# Patient Record
Sex: Female | Born: 1990 | Race: Black or African American | Hispanic: No | Marital: Single | Smoking: Never smoker
Health system: Southern US, Community
[De-identification: ages and names within clinical notes are randomized; demographics above are authoritative.]

## PROBLEM LIST (undated history)

## (undated) ENCOUNTER — Inpatient Hospital Stay: Payer: Self-pay

## (undated) DIAGNOSIS — B9689 Other specified bacterial agents as the cause of diseases classified elsewhere: Secondary | ICD-10-CM

## (undated) DIAGNOSIS — N760 Acute vaginitis: Secondary | ICD-10-CM

## (undated) DIAGNOSIS — A599 Trichomoniasis, unspecified: Secondary | ICD-10-CM

## (undated) HISTORY — DX: Other specified bacterial agents as the cause of diseases classified elsewhere: B96.89

## (undated) HISTORY — DX: Trichomoniasis, unspecified: A59.9

## (undated) HISTORY — DX: Other specified bacterial agents as the cause of diseases classified elsewhere: N76.0

## (undated) HISTORY — PX: NO PAST SURGERIES: SHX2092

---
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
## 2010-01-07 ENCOUNTER — Emergency Department: Payer: Self-pay | Admitting: Emergency Medicine

## 2010-01-07 ENCOUNTER — Emergency Department: Payer: Self-pay | Admitting: Internal Medicine

## 2010-06-27 ENCOUNTER — Emergency Department: Payer: Self-pay | Admitting: Emergency Medicine

## 2011-08-29 ENCOUNTER — Emergency Department: Payer: Self-pay | Admitting: Emergency Medicine

## 2012-01-06 ENCOUNTER — Emergency Department: Payer: Self-pay | Admitting: Internal Medicine

## 2012-01-06 LAB — PREGNANCY, URINE: Pregnancy Test, Urine: NEGATIVE m[IU]/mL

## 2012-01-06 LAB — URINALYSIS, COMPLETE
Bilirubin,UR: NEGATIVE
Blood: NEGATIVE
Glucose,UR: NEGATIVE mg/dL (ref 0–75)
Ketone: NEGATIVE
Protein: NEGATIVE
RBC,UR: NONE SEEN /HPF (ref 0–5)
Specific Gravity: 1.025 (ref 1.003–1.030)

## 2012-07-28 ENCOUNTER — Emergency Department: Payer: Self-pay | Admitting: Emergency Medicine

## 2012-07-28 LAB — URINALYSIS, COMPLETE
Bilirubin,UR: NEGATIVE
Glucose,UR: NEGATIVE mg/dL (ref 0–75)
Ph: 5 (ref 4.5–8.0)
Protein: NEGATIVE
RBC,UR: 3 /HPF (ref 0–5)
Specific Gravity: 1.026 (ref 1.003–1.030)
WBC UR: 589 /HPF (ref 0–5)

## 2012-07-30 LAB — URINE CULTURE

## 2012-10-27 ENCOUNTER — Emergency Department: Payer: Self-pay | Admitting: Emergency Medicine

## 2012-10-27 LAB — COMPREHENSIVE METABOLIC PANEL
Albumin: 3.8 g/dL (ref 3.4–5.0)
Alkaline Phosphatase: 53 U/L (ref 50–136)
Anion Gap: 8 (ref 7–16)
BUN: 12 mg/dL (ref 7–18)
Bilirubin,Total: 0.7 mg/dL (ref 0.2–1.0)
Calcium, Total: 8.3 mg/dL — ABNORMAL LOW (ref 8.5–10.1)
Chloride: 106 mmol/L (ref 98–107)
Co2: 22 mmol/L (ref 21–32)
Creatinine: 0.77 mg/dL (ref 0.60–1.30)
EGFR (African American): >60
Glucose: 100 mg/dL — ABNORMAL HIGH (ref 65–99)
Potassium: 3.4 mmol/L — ABNORMAL LOW (ref 3.5–5.1)
SGOT(AST): 18 U/L (ref 15–37)
SGPT (ALT): 17 U/L (ref 12–78)
Sodium: 136 mmol/L (ref 136–145)
Total Protein: 7.8 g/dL (ref 6.4–8.2)

## 2012-10-27 LAB — URINALYSIS, COMPLETE
Bacteria: NONE SEEN
Glucose,UR: NEGATIVE mg/dL (ref 0–75)
Ketone: NEGATIVE
Leukocyte Esterase: NEGATIVE
Ph: 5 (ref 4.5–8.0)
RBC,UR: 5 /HPF (ref 0–5)
Specific Gravity: 1.035 (ref 1.003–1.030)
WBC UR: 4 /HPF (ref 0–5)

## 2012-10-27 LAB — CBC
HCT: 39.9 % (ref 35.0–47.0)
MCHC: 34.7 g/dL (ref 32.0–36.0)
RDW: 12.2 % (ref 11.5–14.5)
WBC: 7.6 10*3/uL (ref 3.6–11.0)

## 2012-10-27 LAB — RAPID INFLUENZA A&B ANTIGENS

## 2014-01-20 ENCOUNTER — Emergency Department: Payer: Self-pay | Admitting: Internal Medicine

## 2014-12-20 ENCOUNTER — Emergency Department: Admit: 2014-12-20 | Disposition: A | Discharge status: Home / Self Care | Payer: Self-pay | Admitting: Student

## 2015-01-04 DIAGNOSIS — L700 Acne vulgaris: Secondary | ICD-10-CM | POA: Insufficient documentation

## 2015-03-29 ENCOUNTER — Encounter: Payer: Self-pay | Admitting: *Deleted

## 2015-03-29 ENCOUNTER — Emergency Department
Admission: EM | Admit: 2015-03-29 | Discharge: 2015-03-29 | Disposition: A | Discharge status: Home / Self Care | Payer: BLUE CROSS/BLUE SHIELD | Attending: Emergency Medicine | Admitting: Emergency Medicine

## 2015-03-29 ENCOUNTER — Emergency Department: Payer: BLUE CROSS/BLUE SHIELD

## 2015-03-29 DIAGNOSIS — Y9389 Activity, other specified: Secondary | ICD-10-CM | POA: Diagnosis not present

## 2015-03-29 DIAGNOSIS — Y998 Other external cause status: Secondary | ICD-10-CM | POA: Insufficient documentation

## 2015-03-29 DIAGNOSIS — S39012A Strain of muscle, fascia and tendon of lower back, initial encounter: Secondary | ICD-10-CM | POA: Insufficient documentation

## 2015-03-29 DIAGNOSIS — S3992XA Unspecified injury of lower back, initial encounter: Secondary | ICD-10-CM | POA: Diagnosis present

## 2015-03-29 DIAGNOSIS — Y92410 Unspecified street and highway as the place of occurrence of the external cause: Secondary | ICD-10-CM | POA: Insufficient documentation

## 2015-03-29 MED ORDER — KETOROLAC TROMETHAMINE 60 MG/2ML IM SOLN
60.0000 mg | Freq: Once | INTRAMUSCULAR | Status: AC
  Administered 2015-03-29: 60 mg via INTRAMUSCULAR
  Filled 2015-03-29: qty 2

## 2015-03-29 MED ORDER — ORPHENADRINE CITRATE 30 MG/ML IJ SOLN
60.0000 mg | INTRAMUSCULAR | Status: AC
  Administered 2015-03-29: 60 mg via INTRAMUSCULAR
  Filled 2015-03-29: qty 2

## 2015-03-29 MED ORDER — KETOROLAC TROMETHAMINE 10 MG PO TABS: 10.0000 mg | ORAL_TABLET | Freq: Three times a day (TID) | ORAL | Status: DC

## 2015-03-29 MED ORDER — CYCLOBENZAPRINE HCL 5 MG PO TABS: 5.0000 mg | ORAL_TABLET | Freq: Three times a day (TID) | ORAL | Status: DC | PRN
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
## 2015-03-29 NOTE — ED Notes (Signed)
Pt on back board with ccollar on

## 2015-03-29 NOTE — ED Notes (Signed)
Assessment per PA 
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
## 2015-12-20 ENCOUNTER — Encounter: Payer: Self-pay | Admitting: Emergency Medicine

## 2015-12-20 ENCOUNTER — Emergency Department
Admission: EM | Admit: 2015-12-20 | Discharge: 2015-12-20 | Disposition: A | Discharge status: Home / Self Care | Payer: BLUE CROSS/BLUE SHIELD | Attending: Emergency Medicine | Admitting: Emergency Medicine

## 2015-12-20 DIAGNOSIS — K029 Dental caries, unspecified: Secondary | ICD-10-CM | POA: Insufficient documentation

## 2015-12-20 DIAGNOSIS — K0889 Other specified disorders of teeth and supporting structures: Secondary | ICD-10-CM | POA: Diagnosis present

## 2015-12-20 MED ORDER — LIDOCAINE VISCOUS 2 % MT SOLN: 20.0000 mL | OROMUCOSAL | Status: DC | PRN

## 2015-12-20 MED ORDER — LIDOCAINE VISCOUS 2 % MT SOLN
OROMUCOSAL | Status: AC
  Administered 2015-12-20: 15 mL via OROMUCOSAL
  Filled 2015-12-20: qty 15

## 2015-12-20 MED ORDER — LIDOCAINE VISCOUS 2 % MT SOLN
15.0000 mL | Freq: Once | OROMUCOSAL | Status: AC
  Administered 2015-12-20: 15 mL via OROMUCOSAL

## 2015-12-20 MED ORDER — AMOXICILLIN 500 MG PO TABS: 500.0000 mg | ORAL_TABLET | Freq: Two times a day (BID) | ORAL | Status: DC

## 2015-12-20 MED ORDER — HYDROCODONE-ACETAMINOPHEN 5-325 MG PO TABS: 1.0000 | ORAL_TABLET | ORAL | Status: DC | PRN

## 2015-12-20 NOTE — ED Provider Notes (Signed)
[facility] Emergency Department Provider Note  ____________________________________________  Time seen: Approximately 1:46 PM  I have reviewed the triage vital signs and the nursing notes.   HISTORY  Chief Complaint Dental Pain    HPI Colleen Peterson is a 25 y.o. female who presents for evaluation of a toothache. Patient reports she has a Education officer, communitydentist appointment tomorrow. She needs something to help get her through today and tonight.. Patient denies any other trauma or complaints at this time.   History reviewed. No pertinent past medical history.  There are no active problems to display for this patient.   History reviewed. No pertinent past surgical history.  Current Outpatient Rx  Name  Route  Sig  Dispense  Refill  . amoxicillin (AMOXIL) 500 MG tablet   Oral   Take 1 tablet (500 mg total) by mouth 2 (two) times daily.   20 tablet   0   . HYDROcodone-acetaminophen (NORCO) 5-325 MG tablet   Oral   Take 1-2 tablets by mouth every 4 (four) hours as needed for moderate pain.   15 tablet   0   . ISOtretinoin (ACCUTANE) 40 MG capsule   Oral   Take 40 mg by mouth 2 (two) times daily.         Marland Kitchen. lidocaine (XYLOCAINE) 2 % solution   Mouth/Throat   Use as directed 20 mLs in the mouth or throat as needed for mouth pain.   100 mL   0     Allergies Review of patient's allergies indicates no known allergies.  No family history on file.  Social History Social History  Substance Use Topics  . Smoking status: Never Smoker   . Smokeless tobacco: None  . Alcohol Use: Yes    Review of Systems Constitutional: No fever/chills Eyes: No visual changes. ENT: Positive for dental caries and dental pain left upper molars. Positive erythema around the gums. Genitourinary: Negative for dysuria. Musculoskeletal: Negative for back pain. Skin: Negative for rash. Neurological: Negative for headaches, focal weakness or numbness.  10-point ROS  otherwise negative.  ____________________________________________   PHYSICAL EXAM:  VITAL SIGNS: ED Triage Vitals  Enc Vitals Group     BP 12/20/15 1314 135/82 mmHg     Pulse Rate 12/20/15 1314 78     Resp 12/20/15 1314 16     Temp 12/20/15 1314 98.4 F (36.9 C)     Temp Source 12/20/15 1314 Oral     SpO2 12/20/15 1314 98 %     Weight 12/20/15 1314 155 lb (70.308 kg)     Height 12/20/15 1314 5\' 4"  (1.626 m)     Head Cir --      Peak Flow --      Pain Score 12/20/15 1309 10     Pain Loc --      Pain Edu? --      Excl. in GC? --     Constitutional: Alert and oriented. Well appearing and in no acute distress.n/rhinnorhea. Mouth/Throat: Mucous membranes are moist.  Oropharynx non-erythematous.Positive for dental caries. Neck: No stridor.   Neurologic:  Normal speech and language. No gross focal neurologic deficits are appreciated. No gait instability. Skin:  Skin is warm, dry and intact. No rash noted. Psychiatric: Mood and affect are normal. Speech and behavior are normal.  ____________________________________________   LABS (all labs ordered are listed, but only abnormal results are displayed)  Labs Reviewed - No data to display ____________________________________________    PROCEDURES  Procedure(s) performed: None  Critical Care performed: No  ____________________________________________   INITIAL IMPRESSION / ASSESSMENT AND PLAN / ED COURSE  Pertinent labs & imaging results that were available during my care of the patient were reviewed by me and considered in my medical decision making (see chart for details).  Positive dental pain with dental caries. Rx given for Amoxil 500 mg 3 times a day hydrocodone 5/325 viscous lidocaine. Patient to follow up PCP or return to the ER as needed. She denies any other emergency medical complaints at this time. ____________________________________________   FINAL CLINICAL IMPRESSION(S) / ED DIAGNOSES  Final  diagnoses:  Pain, dental     This chart was dictated using voice recognition software/Dragon. Despite best efforts to proofread, errors can occur which can change the meaning. Any change was purely unintentional.   Evangeline Dakin, PA-C 12/20/15 1541  Myrna Blazer, MD 12/20/15 [phone]

## 2015-12-20 NOTE — Discharge Instructions (Signed)
OPTIONS FOR DENTAL FOLLOW UP CARE ° °[place] Department of Health and Human Services - Local Safety Net Dental Clinics °http://www.ncdhhs.gov/dph/oralhealth/services/safetynetclinics.htm °  °[facility] (336-562-3123) ° °[facility] (919-933-9087) ° °[facility] ([phone] ext [phone]) ° °[facility] ([phone]) ° °[facility] ([phone]) °This clinic caters to the indigent population and is on a lottery system. °Location: °[facility], [address], [place] °Clinic Hours: °Wednesdays from 6pm - 9pm, patients seen by a lottery system. °For dates, call or go to [email or web address].[facility].edu/[facility]/patients/Dental-[facility] °Services: °Cleanings, fillings and simple extractions. °Payment Options: °DENTAL WORK IS FREE OF CHARGE. Bring proof of income or support. °Best way to get seen: °Arrive at 5:15 pm - this is a lottery, NOT first come/first serve, so arriving earlier will not increase your chances of being seen. °  °  °[facility] °[phone] °Select option 1 for emergencies °  °Location: °[facility], [address], [place] °Clinic Hours: °No walk-ins accepted - call the day before to schedule an appointment. °Check in times are 9:30 am and 1:30 pm. °Services: °Simple extractions, temporary fillings, pulpectomy/pulp debridement, uncomplicated abscess drainage. °Payment Options: °PAYMENT IS DUE AT THE TIME OF SERVICE.  Fee is usually $100-200, additional surgical procedures (e.g. abscess drainage) may be extra. °Cash, checks, Visa/MasterCard accepted.  Can file Medicaid if patient is covered for dental - patient should call case worker to check. °No discount for [facility] patients. °Best way to get seen: °MUST call the day before and get onto the schedule. Can usually be seen the next 1-2 days. No walk-ins accepted. °  °  °[facility] °[phone] °   °Location: °[facility], [address], [place] °Clinic Hours: °M, W, Th, F 8am or 1:30pm, Tues 9a or 1:30 - first come/first served. °Services: °Simple extractions, temporary fillings, uncomplicated abscess drainage.  You do not need to be an [place] resident. °Payment Options: °PAYMENT IS DUE AT THE TIME OF SERVICE. °Dental insurance, otherwise sliding scale - bring proof of income or support. °Depending on income and treatment needed, cost is usually $50-200. °Best way to get seen: °Arrive early as it is first come/first served. °  °  °[facility] °[phone] °  °Location: °[address] °Clinic Hours: °Mon-Thu 8a-5p °Services: °Most basic dental services including extractions and fillings. °Payment Options: °PAYMENT IS DUE AT THE TIME OF SERVICE. °Sliding scale, up to 50% off - bring proof if income or support. °Medicaid with dental option accepted. °Best way to get seen: °Call to schedule an appointment, can usually be seen within 2 weeks OR they will try to see walk-ins - show up at 8a or 2p (you may have to wait). °  °  °[facility] °919-245-2435 °[place] RESIDENTS ONLY °  °Location: °[facility], [address], [place], [place] [address] °Clinic Hours: By appointment only. °Monday - Thursday 8am-5pm, Friday 8am-12pm °Services: Cleanings, fillings, extractions. °Payment Options: °PAYMENT IS DUE AT THE TIME OF SERVICE. °Cash, Visa or MasterCard. Sliding scale - $30 minimum per service. °Best way to get seen: °Come in to office, complete packet and make an appointment - need proof of income °or support monies for each household member and proof of [place] residence. °Usually takes about a month to get in. °  °  °[facility] °[phone] °  °Location: °[address]   [place] °Clinic Hours: Walk-in Urgent Care Dental Services are offered Monday-Friday  mornings only. °The numbers of emergencies accepted daily is limited to the number of °providers available. °Maximum 15 - Mondays, Wednesdays & Thursdays °Maximum 10 - Tuesdays & Fridays °Services: °You do not need to be a [place] resident to be seen for a dental emergency. °Emergencies are defined as pain, swelling, abnormal bleeding, or dental trauma. Walkins will receive x-rays if needed. °NOTE: Dental cleaning is not an emergency. °Payment Options: °PAYMENT IS DUE AT THE TIME OF SERVICE. °Minimum co-pay is $40.00 for uninsured patients. °Minimum co-pay is $3.00 for Medicaid with dental coverage. °Dental Insurance is accepted and must be presented at time of visit. °Medicare does not cover dental. °Forms of payment: Cash, credit card, checks. °Best way to get seen: °If not previously registered with the clinic, walk-in dental registration begins at 7:15 am and is on a first come/first serve basis. °If previously registered with the clinic, call to make an appointment. °  °  °[facility] °[phone] °[place] RESIDENTS ONLY °  °Location: °[address], [place], [place] °Clinic Hours: °Mon-Thu 10a-2p °Services: Extractions only! °Payment Options: °FREE (donations accepted) - bring proof of income or support °Best way to get seen: °Call and schedule an appointment OR come at 8am on the 1st Monday of every month (except for holidays) when it is first come/first served. °  °  °[facility] °[phone] °  °Location: °[address], [place] °Clinic Hours: °Friday mornings °Services, Payment Options, Best way to get seen: °Call for info °

## 2015-12-20 NOTE — ED Notes (Signed)
Toothache x 2 days.  States she has an appointment tomorrow with dentist.

## 2016-09-27 ENCOUNTER — Encounter: Payer: Self-pay | Admitting: Certified Nurse Midwife

## 2016-09-28 ENCOUNTER — Encounter: Payer: Self-pay | Admitting: Certified Nurse Midwife

## 2016-09-28 ENCOUNTER — Ambulatory Visit (INDEPENDENT_AMBULATORY_CARE_PROVIDER_SITE_OTHER): Payer: BLUE CROSS/BLUE SHIELD | Admitting: Certified Nurse Midwife

## 2016-09-28 VITALS — BP 107/74 | HR 85 | Ht 64.0 in | Wt 164.0 lb

## 2016-09-28 DIAGNOSIS — Z3201 Encounter for pregnancy test, result positive: Secondary | ICD-10-CM | POA: Diagnosis not present

## 2016-09-28 DIAGNOSIS — N926 Irregular menstruation, unspecified: Secondary | ICD-10-CM | POA: Diagnosis not present

## 2016-09-28 LAB — POCT URINE PREGNANCY: Preg Test, Ur: POSITIVE — AB

## 2016-09-28 NOTE — Progress Notes (Signed)
GYN ENCOUNTER NOTE  Subjective:       Colleen Peterson is a 26 y.o. [id number] female is here for gynecologic evaluation of the following issues: positive home pregnancy test and missed menses.   Reports a positive home pregnancy test x one (1) on Tuesday, 09/26/2016. She is having difficulty sleeping, but denies nausea, vomiting, and breast tenderness.   Colleen Peterson stopped taking birth control about a year ago due to insurance change. This is an unplanned, undesired pregnancy.   Last intercourse Saturday, 09/22/2016.  Denies difficulty breathing or respiratory distress, chest pain, abdominal pain, vaginal bleeding, and leg pain or swelling.    Gynecologic History  Patient's last menstrual period was 08/27/2016.   Contraception: none  EDD: 06/03/2017  Gestational age: 29 week 4 days  Obstetric History OB History  Gravida Para Term Preterm AB Living  2       1    SAB TAB Ectopic Multiple Live Births  1            # Outcome Date GA Lbr Len/2nd Weight Sex Delivery Anes PTL Lv  2 Current           1 SAB 2011              History reviewed. No pertinent past medical history.  History reviewed. No pertinent surgical history.  No current outpatient prescriptions on file prior to visit.   No current facility-administered medications on file prior to visit.     No Known Allergies  Social History   Social History  . Marital status: Single    Spouse name: N/A  . Number of children: N/A  . Years of education: N/A   Occupational History  . Not on file.   Social History Main Topics  . Smoking status: Never Smoker  . Smokeless tobacco: Never Used  . Alcohol use Yes     Comment: Socially   . Drug use: No  . Sexual activity: Not on file   Other Topics Concern  . Not on file   Social History Narrative  . No narrative on file    History reviewed. No pertinent family history.  The following portions of the patient's history were reviewed and updated as appropriate:  allergies, current medications, past family history, past medical history, past social history, past surgical history and problem list.  Review of Systems  Review of Systems - Negative except for as noted above History obtained from the patient  Objective:   BP 107/74 (BP Location: Left Arm, Patient Position: Sitting, Cuff Size: Normal)   Pulse 85   Ht 5\' 4"  (1.626 m)   Wt 164 lb (74.4 kg)   LMP 08/27/2016   BMI 28.15 kg/m   Alert and oriented x 4  Physical exam: not indicated  Assessment:   1. Pregnancy test positive - POCT urine pregnancy  2. Missed menses  Plan:   1. Counseled pt about pregnancy options including termination, parenting, and adoption. Risks and benefits reviewed.   2. Information given on [facility] [place].   3. RTC x 2-4 weeks for birth control counseling or sooner if needed.    Gunnar BullaJenkins Michelle Mallery Harshman, CNM

## 2016-10-16 ENCOUNTER — Encounter: Payer: Self-pay | Admitting: Emergency Medicine

## 2016-10-16 ENCOUNTER — Emergency Department
Admission: EM | Admit: 2016-10-16 | Discharge: 2016-10-16 | Disposition: A | Discharge status: Home / Self Care | Payer: BLUE CROSS/BLUE SHIELD | Attending: Emergency Medicine | Admitting: Emergency Medicine

## 2016-10-16 ENCOUNTER — Emergency Department: Payer: BLUE CROSS/BLUE SHIELD

## 2016-10-16 DIAGNOSIS — R112 Nausea with vomiting, unspecified: Secondary | ICD-10-CM | POA: Diagnosis not present

## 2016-10-16 DIAGNOSIS — R1030 Lower abdominal pain, unspecified: Secondary | ICD-10-CM | POA: Diagnosis not present

## 2016-10-16 DIAGNOSIS — R197 Diarrhea, unspecified: Secondary | ICD-10-CM | POA: Diagnosis not present

## 2016-10-16 LAB — URINALYSIS, COMPLETE (UACMP) WITH MICROSCOPIC
BACTERIA UA: NONE SEEN
Bilirubin Urine: NEGATIVE
Glucose, UA: NEGATIVE mg/dL
KETONES UR: NEGATIVE mg/dL
LEUKOCYTES UA: NEGATIVE
Nitrite: NEGATIVE
PROTEIN: 30 mg/dL — AB
Specific Gravity, Urine: 1.029 (ref 1.005–1.030)
pH: 5 (ref 5.0–8.0)

## 2016-10-16 LAB — CBC WITH DIFFERENTIAL/PLATELET
Basophils Absolute: 0 10*3/uL (ref 0–0.1)
Basophils Relative: 0 %
EOS ABS: 0 10*3/uL (ref 0–0.7)
Eosinophils Relative: 0 %
HCT: 40.3 % (ref 35.0–47.0)
Hemoglobin: 13.7 g/dL (ref 12.0–16.0)
Lymphocytes Relative: 7 %
Lymphs Abs: 0.9 10*3/uL — ABNORMAL LOW (ref 1.0–3.6)
MCH: 32.3 pg (ref 26.0–34.0)
MCHC: 34 g/dL (ref 32.0–36.0)
MCV: 95.1 fL (ref 80.0–100.0)
Monocytes Absolute: 0.4 10*3/uL (ref 0.2–0.9)
Monocytes Relative: 3 %
Neutro Abs: 12.5 10*3/uL — ABNORMAL HIGH (ref 1.4–6.5)
Neutrophils Relative %: 90 %
Platelets: 264 10*3/uL (ref 150–440)
RBC: 4.23 MIL/uL (ref 3.80–5.20)
RDW: 12.3 % (ref 11.5–14.5)
WBC: 13.9 10*3/uL — AB (ref 3.6–11.0)

## 2016-10-16 LAB — COMPREHENSIVE METABOLIC PANEL
ALT: 12 U/L — ABNORMAL LOW (ref 14–54)
AST: 24 U/L (ref 15–41)
Albumin: 4.6 g/dL (ref 3.5–5.0)
Alkaline Phosphatase: 50 U/L (ref 38–126)
Anion gap: 8 (ref 5–15)
BUN: 13 mg/dL (ref 6–20)
CALCIUM: 8.9 mg/dL (ref 8.9–10.3)
CO2: 22 mmol/L (ref 22–32)
CREATININE: 0.76 mg/dL (ref 0.44–1.00)
Chloride: 104 mmol/L (ref 101–111)
GFR calc non Af Amer: >60 mL/min (ref >60)
GLUCOSE: 99 mg/dL (ref 65–99)
Potassium: 4.5 mmol/L (ref 3.5–5.1)
Sodium: 134 mmol/L — ABNORMAL LOW (ref 135–145)
Total Bilirubin: 0.7 mg/dL (ref 0.3–1.2)
Total Protein: 8.5 g/dL — ABNORMAL HIGH (ref 6.5–8.1)

## 2016-10-16 LAB — INFLUENZA PANEL BY PCR (TYPE A & B)
INFLAPCR: NEGATIVE
Influenza B By PCR: NEGATIVE

## 2016-10-16 LAB — LIPASE, BLOOD: Lipase: 20 U/L (ref 11–51)

## 2016-10-16 LAB — POCT PREGNANCY, URINE: PREG TEST UR: NEGATIVE

## 2016-10-16 MED ORDER — ONDANSETRON HCL 4 MG/2ML IJ SOLN
4.0000 mg | Freq: Once | INTRAMUSCULAR | Status: AC
  Administered 2016-10-16: 4 mg via INTRAVENOUS
  Filled 2016-10-16: qty 2

## 2016-10-16 MED ORDER — ONDANSETRON 4 MG PO TBDP
4.0000 mg | ORAL_TABLET | Freq: Once | ORAL | Status: AC
  Administered 2016-10-16: 4 mg via ORAL

## 2016-10-16 MED ORDER — SODIUM CHLORIDE 0.9 % IV BOLUS (SEPSIS)
1000.0000 mL | Freq: Once | INTRAVENOUS | Status: AC
  Administered 2016-10-16: 1000 mL via INTRAVENOUS

## 2016-10-16 MED ORDER — IOPAMIDOL (ISOVUE-300) INJECTION 61%
100.0000 mL | Freq: Once | INTRAVENOUS | Status: AC | PRN
  Administered 2016-10-16: 100 mL via INTRAVENOUS

## 2016-10-16 MED ORDER — ONDANSETRON 4 MG PO TBDP
ORAL_TABLET | ORAL | Status: AC
  Filled 2016-10-16: qty 1

## 2016-10-16 MED ORDER — ONDANSETRON HCL 4 MG PO TABS: 4.0000 mg | ORAL_TABLET | Freq: Every day | ORAL | 0 refills | Status: DC | PRN

## 2016-10-16 MED ORDER — IOPAMIDOL (ISOVUE-300) INJECTION 61%
30.0000 mL | Freq: Once | INTRAVENOUS | Status: AC
  Administered 2016-10-16: 30 mL via ORAL

## 2016-10-16 NOTE — ED Notes (Signed)
Pt reports vomiting and diarrhea since 2000 tonight.  n o abd pain .  No back pain.  Denies urinary sx.  No h/a. Pt a lert.  Speech clear.

## 2016-10-16 NOTE — ED Triage Notes (Signed)
Pt presents to ED with frequent vomiting and watery diarrhea since 2000 tonight. Denies abd pain.

## 2016-10-16 NOTE — ED Notes (Signed)
Pt became nauseous when she was about to leave. Nausea medication was given to Pt and MD ordered the Pt to be monitored until the medication takes effect.

## 2016-10-16 NOTE — ED Provider Notes (Signed)
[facility] Emergency Department Provider Note  ____________________________________________   First MD Initiated Contact with Patient 10/16/16 0122     (approximate)  I have reviewed the triage vital signs and the nursing notes.   HISTORY  Chief Complaint Emesis and Diarrhea   HPI Colleen Peterson is a 26 y.o. female without any chronic medical problems who is presenting to the emergency department tonight with nausea vomiting and diarrhea. She says that she may have vomited and had 10 episodes of diarrhea. Denies any blood in her vomitus or stool. Says that she also had a runny nose earlier today. Denies any fever or body aches. Denies any pain at this time. Denies any known sick contacts.   History reviewed. No pertinent past medical history.  There are no active problems to display for this patient.   History reviewed. No pertinent surgical history.  Prior to Admission medications   Not on File    Allergies Patient has no known allergies.  No family history on file.  Social History Social History  Substance Use Topics  . Smoking status: Never Smoker  . Smokeless tobacco: Never Used  . Alcohol use Yes     Comment: Socially     Review of Systems Constitutional: No fever/chills Eyes: No visual changes. ENT: No sore throat. Cardiovascular: Denies chest pain. Respiratory: Denies shortness of breath. Gastrointestinal: No abdominal pain.  no constipation. Genitourinary: Negative for dysuria. Musculoskeletal: Negative for back pain. Skin: Negative for rash. Neurological: Negative for headaches, focal weakness or numbness.  10-point ROS otherwise negative.  ____________________________________________   PHYSICAL EXAM:  VITAL SIGNS: ED Triage Vitals [10/16/16 0058]  Enc Vitals Group     BP (!) 110/53     Pulse Rate (!) 108     Resp 20     Temp 97.8 F (36.6 C)     Temp Source Oral     SpO2 99 %     Weight 164 lb (74.4 kg)      Height 5\' 4"  (1.626 m)     Head Circumference      Peak Flow      Pain Score 6     Pain Loc      Pain Edu?      Excl. in GC?     Constitutional: Alert and oriented. Well appearing and in no acute distress. Eyes: Conjunctivae are normal. PERRL. EOMI. Head: Atraumatic. Nose: No congestion/rhinnorhea. Mouth/Throat: Mucous membranes are moist.   Neck: No stridor.   Cardiovascular: Normal rate, regular rhythm. Grossly normal heart sounds.  Good peripheral circulation. Respiratory: Normal respiratory effort.  No retractions. Lungs CTAB. Gastrointestinal: Soft With minimal suprapubic tenderness to palpation. No distention.  Musculoskeletal: No lower extremity tenderness nor edema.  No joint effusions. Neurologic:  Normal speech and language. No gross focal neurologic deficits are appreciated.  Skin:  Skin is warm, dry and intact. No rash noted. Psychiatric: Mood and affect are normal. Speech and behavior are normal.  ____________________________________________   LABS (all labs ordered are listed, but only abnormal results are displayed)  Labs Reviewed  CBC WITH DIFFERENTIAL/PLATELET - Abnormal; Notable for the following:       Result Value   WBC 13.9 (*)    Neutro Abs 12.5 (*)    Lymphs Abs 0.9 (*)    All other components within normal limits  COMPREHENSIVE METABOLIC PANEL - Abnormal; Notable for the following:    Sodium 134 (*)    Total Protein 8.5 (*)  ALT 12 (*)    All other components within normal limits  URINALYSIS, COMPLETE (UACMP) WITH MICROSCOPIC - Abnormal; Notable for the following:    Color, Urine YELLOW (*)    APPearance CLOUDY (*)    Hgb urine dipstick SMALL (*)    Protein, ur 30 (*)    Squamous Epithelial / LPF 6-30 (*)    All other components within normal limits  LIPASE, BLOOD  INFLUENZA PANEL BY PCR (TYPE A & B)  POC URINE PREG, ED  POCT PREGNANCY, URINE    ____________________________________________  EKG   ____________________________________________  RADIOLOGY    CT Abdomen Pelvis W Contrast (Final result)  Result time 10/16/16 04:48:07  Final result by Awilda Metro, MD (10/16/16 04:48:07)           Narrative:   CLINICAL DATA: Vomiting and diarrhea beginning at 2000 hours tonight.  EXAM: CT ABDOMEN AND PELVIS WITH CONTRAST  TECHNIQUE: Multidetector CT imaging of the abdomen and pelvis was performed using the standard protocol following bolus administration of intravenous contrast.  CONTRAST: ISOVUE-300 IOPAMIDOL (ISOVUE-300) INJECTION 61%  COMPARISON: None.  FINDINGS: LOWER CHEST: Lung bases are clear. Included heart size is normal. No pericardial effusion.  HEPATOBILIARY: Liver and gallbladder are normal.  PANCREAS: Normal.  SPLEEN: Normal.  ADRENALS/URINARY TRACT: Kidneys are orthotopic, demonstrating symmetric enhancement. No nephrolithiasis, hydronephrosis or solid renal masses. The unopacified ureters are normal in course and caliber.Urinary bladder is partially distended and unremarkable. Normal adrenal glands.  STOMACH/BOWEL: The stomach, small and large bowel are normal in course and caliber without inflammatory changes. Oral contrast limited to the stomach. Small and large bowel air-fluid levels. Normal appendix.  VASCULAR/LYMPHATIC: Aortoiliac vessels are normal in course and caliber. No lymphadenopathy by CT size criteria.  REPRODUCTIVE: Normal. 4 mm RIGHT ovarian involuting follicle.  OTHER: No intraperitoneal free fluid or free air.  MUSCULOSKELETAL: Nonacute. 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  IMPRESSION: Fluid within small large bowel compatible with enterocolitis. Normal appendix.   Electronically Signed By: Awilda Metro M.D. On: 10/16/2016  04:48          ____________________________________________   PROCEDURES  Procedure(s) performed:   Procedures  Critical Care performed:   ____________________________________________   INITIAL IMPRESSION / ASSESSMENT AND PLAN / ED COURSE  Pertinent labs & imaging results that were available during my care of the patient were reviewed by me and considered in my medical decision making (see chart for details).  ----------------------------------------- 5:12 AM on 10/16/2016 -----------------------------------------  Patient resting comfortably at this time. Able tolerate by mouth contrast. Initially CAT scan because of right lower quadrant tenderness which seemed to be increasingly localizing as time went on throughout her visit. However, her CAT scan is very reassuring. She is only minimally tender at this time. Will be discharged home. Likely viral illness. We will discharge with Zofran. Patient also reported recent abortion on January 31. Said that she was also tested for STDs just prior to the abortion and was negative. Says that has minimal vaginal bleeding at this time which has been steadily decreasing.      ____________________________________________   FINAL CLINICAL IMPRESSION(S) / ED DIAGNOSES  Abdominal pain with nausea vomiting and diarrhea.    NEW MEDICATIONS STARTED DURING THIS VISIT:  New Prescriptions   No medications on file     Note:  This document was prepared using Dragon voice recognition software and may include unintentional dictation errors.  Myrna Blazeravid Matthew Parsa Rickett, MD 10/16/16 [phone]

## 2016-10-16 NOTE — ED Notes (Signed)
Report off to henry rn  

## 2017-05-04 DIAGNOSIS — A599 Trichomoniasis, unspecified: Secondary | ICD-10-CM

## 2017-05-04 HISTORY — DX: Trichomoniasis, unspecified: A59.9

## 2017-05-22 ENCOUNTER — Encounter: Payer: Self-pay | Admitting: Obstetrics and Gynecology

## 2017-05-22 ENCOUNTER — Ambulatory Visit (INDEPENDENT_AMBULATORY_CARE_PROVIDER_SITE_OTHER): Payer: BLUE CROSS/BLUE SHIELD | Admitting: Obstetrics and Gynecology

## 2017-05-22 VITALS — BP 120/80 | Ht 63.0 in | Wt 161.0 lb

## 2017-05-22 DIAGNOSIS — B9689 Other specified bacterial agents as the cause of diseases classified elsewhere: Secondary | ICD-10-CM | POA: Diagnosis not present

## 2017-05-22 DIAGNOSIS — N760 Acute vaginitis: Secondary | ICD-10-CM

## 2017-05-22 DIAGNOSIS — A599 Trichomoniasis, unspecified: Secondary | ICD-10-CM

## 2017-05-22 LAB — POCT WET PREP WITH KOH
Clue Cells Wet Prep HPF POC: POSITIVE
KOH PREP POC: POSITIVE — AB
Trichomonas, UA: POSITIVE
Yeast Wet Prep HPF POC: NEGATIVE

## 2017-05-22 MED ORDER — METRONIDAZOLE 500 MG PO TABS: ORAL_TABLET | ORAL | 0 refills | Status: DC

## 2017-05-22 NOTE — Progress Notes (Signed)
Chief Complaint  Patient presents with  . Vaginitis    HPI:      Ms. Colleen Peterson is a 26 y.o. G2P0010 who LMP was Patient's last menstrual period was 05/04/2017., presents today for vaginal odor, increased d/c, no irritation/itch. Sx for a couple months. Has not used any meds to treat. No recent abx use. No hx of BV. She is sex active, no new partners. Using condoms. No LBP, belly pain, fevers.    History reviewed. No pertinent past medical history.  History reviewed. No pertinent surgical history.  Family History  Problem Relation Age of Onset  . Breast cancer Maternal Grandmother     Social History   Social History  . Marital status: Single    Spouse name: N/A  . Number of children: N/A  . Years of education: N/A   Occupational History  . Not on file.   Social History Main Topics  . Smoking status: Never Smoker  . Smokeless tobacco: Never Used  . Alcohol use Yes     Comment: Socially   . Drug use: No  . Sexual activity: Yes    Birth control/ protection: None   Other Topics Concern  . Not on file   Social History Narrative  . No narrative on file     Current Outpatient Prescriptions:  .  metroNIDAZOLE (FLAGYL) 500 MG tablet, Take 2 tabs BID for 1 day, Disp: 4 tablet, Rfl: 0 .  ondansetron (ZOFRAN) 4 MG tablet, Take 1 tablet (4 mg total) by mouth daily as needed., Disp: 10 tablet, Rfl: 0   ROS:  Review of Systems  Constitutional: Negative for fever.  Gastrointestinal: Negative for blood in stool, constipation, diarrhea, nausea and vomiting.  Genitourinary: Positive for vaginal discharge. Negative for dyspareunia, dysuria, flank pain, frequency, hematuria, urgency, vaginal bleeding and vaginal pain.  Musculoskeletal: Negative for back pain.  Skin: Negative for rash.     OBJECTIVE:   Vitals:  BP 120/80   Ht  (1.6 m)   Wt 161 lb (73 kg)   LMP 05/04/2017   BMI 28.52 kg/m   Physical Exam  Constitutional: She is oriented to person,  place, and time and well-developed, well-nourished, and in no distress. Vital signs are normal.  Genitourinary: Uterus normal, cervix normal, right adnexa normal, left adnexa normal and vulva normal. Uterus is not enlarged. Cervix exhibits no motion tenderness and no tenderness. Right adnexum displays no mass and no tenderness. Left adnexum displays no mass and no tenderness. Vulva exhibits no erythema, no exudate, no lesion, no rash and no tenderness. Vagina exhibits no lesion. Thin  odorless  white and vaginal discharge found.  Neurological: She is oriented to person, place, and time.  Vitals reviewed.   Results: Results for orders placed or performed in visit on 05/22/17 (from the past 24 hour(s))  POCT Wet Prep with KOH     Status: Abnormal   Collection Time: 05/22/17  4:51 PM  Result Value Ref Range   Trichomonas, UA Positive    Clue Cells Wet Prep HPF POC pos    Epithelial Wet Prep HPF POC  Few, Moderate, Many, Too numerous to count   Yeast Wet Prep HPF POC neg    Bacteria Wet Prep HPF POC  Few   RBC Wet Prep HPF POC     WBC Wet Prep HPF POC     KOH Prep POC Positive (A) Negative     Assessment/Plan: Bacterial vaginosis - Rx Flagyl. Will RF if  sx recur. F/u prn. - Plan: POCT Wet Prep with KOH, metroNIDAZOLE (FLAGYL) 500 MG tablet  Trichomoniasis - Rx flagyl. Parnter needs tx. RTO in 4-6 wks for annual/STD testing/TOC. - Plan: POCT Wet Prep with KOH, metroNIDAZOLE (FLAGYL) 500 MG tablet    Meds ordered this encounter  Medications  . metroNIDAZOLE (FLAGYL) 500 MG tablet    Sig: Take 2 tabs BID for 1 day    Dispense:  4 tablet    Refill:  0      Return in about 5 weeks (around 06/26/2017), or annual.  Helmut Muster B. Leisa Gault, PA-C 05/22/2017 4:52 PM

## 2017-05-28 ENCOUNTER — Ambulatory Visit (INDEPENDENT_AMBULATORY_CARE_PROVIDER_SITE_OTHER): Payer: BLUE CROSS/BLUE SHIELD | Admitting: Obstetrics and Gynecology

## 2017-05-28 ENCOUNTER — Encounter: Payer: Self-pay | Admitting: Obstetrics and Gynecology

## 2017-05-28 VITALS — BP 112/68 | Ht 63.0 in | Wt 160.0 lb

## 2017-05-28 DIAGNOSIS — Z113 Encounter for screening for infections with a predominantly sexual mode of transmission: Secondary | ICD-10-CM

## 2017-05-28 DIAGNOSIS — A599 Trichomoniasis, unspecified: Secondary | ICD-10-CM | POA: Diagnosis not present

## 2017-05-28 DIAGNOSIS — Z202 Contact with and (suspected) exposure to infections with a predominantly sexual mode of transmission: Secondary | ICD-10-CM | POA: Diagnosis not present

## 2017-05-28 MED ORDER — DOXYCYCLINE HYCLATE 100 MG PO CAPS: 100.0000 mg | ORAL_CAPSULE | Freq: Two times a day (BID) | ORAL | 0 refills | Status: DC

## 2017-05-28 NOTE — Addendum Note (Signed)
Addended by: Althea Grimmer B on: 05/28/2017 01:27 PM   Modules accepted: Orders

## 2017-05-28 NOTE — Progress Notes (Addendum)
   Chief Complaint  Patient presents with  . Follow-up    STD exposure     HPI:      Ms. Colleen Peterson is a 26 y.o. G2P0010 who LMP was Patient's last menstrual period was 05/04/2017., presents today for STD test of cure. She was diagnosed with trichomonas May 22, 2017. She was treated with flagyl. Her partner has been treated, but he also then tested positive for chlamydia and did tx. She denies any further symptoms but wants to be screened for gon/chlam.  We were going to do this at her annual, but it is best to do it sooner due to known exposure. No LBP, belly pain, fevers. No vag sx. She has not been sex active with him since he had trich and chlamydia tx last wk.  Annual sched 06/27/17.   Past Medical History:  Diagnosis Date  . BV (bacterial vaginosis)   . Trichomoniasis 05/2017    Social History   Social History  . Marital status: Single    Spouse name: N/A  . Number of children: N/A  . Years of education: N/A   Occupational History  . Not on file.   Social History Main Topics  . Smoking status: Never Smoker  . Smokeless tobacco: Never Used  . Alcohol use Yes     Comment: Socially   . Drug use: No  . Sexual activity: Yes    Birth control/ protection: None   Other Topics Concern  . Not on file   Social History Narrative  . No narrative on file     Current Outpatient Prescriptions:  .  doxycycline (VIBRAMYCIN) 100 MG capsule, Take 1 capsule (100 mg total) by mouth 2 (two) times daily., Disp: 14 capsule, Rfl: 0   ROS:  Review of Systems  Constitutional: Negative for fever.  Gastrointestinal: Negative for blood in stool, constipation, diarrhea, nausea and vomiting.  Genitourinary: Negative for dyspareunia, dysuria, flank pain, frequency, hematuria, urgency, vaginal bleeding, vaginal discharge and vaginal pain.  Musculoskeletal: Negative for back pain.  Skin: Negative for rash.     Objective: BP 112/68 (BP Location: Left Arm, Patient  Position: Sitting, Cuff Size: Normal)   Ht  (1.6 m)   Wt 160 lb (72.6 kg)   LMP 05/04/2017   BMI 28.34 kg/m    Physical Exam  Genitourinary: There is no rash, tenderness, lesion or Bartholin's cyst on the right labia. There is no rash, tenderness, lesion or Bartholin's cyst on the left labia. No erythema, tenderness or bleeding in the vagina. No vaginal discharge found. Right adnexum does not display mass and does not display tenderness. Left adnexum does not display mass and does not display tenderness. Cervix does not exhibit motion tenderness, discharge or friability. Uterus is not enlarged or tender.  Nursing note and vitals reviewed.    Assessment/Plan: Trichomonas infection - TOC today.  Chlamydia contact - Nuswab today. Treat empirically with doxy. Will call pt with results.  - Plan: doxycycline (VIBRAMYCIN) 100 MG capsule, Chlamydia/Gonococcus/Trichomonas, NAA  Screening for STD (sexually transmitted disease) - Plan: Chlamydia/Gonococcus/Trichomonas, NAA    F/U  Return if symptoms worsen or fail to improve.  Melva Faux B. Viyan Rosamond, PA-C 05/28/2017 1:27 PM

## 2017-05-30 LAB — CHLAMYDIA/GONOCOCCUS/TRICHOMONAS, NAA
Chlamydia by NAA: NEGATIVE
Gonococcus by NAA: NEGATIVE
Trich vag by NAA: NEGATIVE

## 2017-06-01 ENCOUNTER — Emergency Department (HOSPITAL_COMMUNITY)
Admission: EM | Admit: 2017-06-01 | Discharge: 2017-06-02 | Disposition: A | Discharge status: Home / Self Care | Payer: BLUE CROSS/BLUE SHIELD | Attending: Emergency Medicine | Admitting: Emergency Medicine

## 2017-06-01 ENCOUNTER — Encounter (HOSPITAL_COMMUNITY): Payer: Self-pay | Admitting: Emergency Medicine

## 2017-06-01 DIAGNOSIS — O26891 Other specified pregnancy related conditions, first trimester: Secondary | ICD-10-CM | POA: Diagnosis not present

## 2017-06-01 DIAGNOSIS — O0281 Inappropriate change in quantitative human chorionic gonadotropin (hCG) in early pregnancy: Secondary | ICD-10-CM | POA: Diagnosis not present

## 2017-06-01 DIAGNOSIS — M25511 Pain in right shoulder: Secondary | ICD-10-CM | POA: Diagnosis not present

## 2017-06-01 DIAGNOSIS — S199XXA Unspecified injury of neck, initial encounter: Secondary | ICD-10-CM | POA: Diagnosis not present

## 2017-06-01 DIAGNOSIS — M542 Cervicalgia: Secondary | ICD-10-CM | POA: Insufficient documentation

## 2017-06-01 DIAGNOSIS — M549 Dorsalgia, unspecified: Secondary | ICD-10-CM | POA: Diagnosis not present

## 2017-06-01 DIAGNOSIS — Z3A01 Less than 8 weeks gestation of pregnancy: Secondary | ICD-10-CM | POA: Insufficient documentation

## 2017-06-01 DIAGNOSIS — T148XXA Other injury of unspecified body region, initial encounter: Secondary | ICD-10-CM | POA: Diagnosis not present

## 2017-06-01 DIAGNOSIS — O9989 Other specified diseases and conditions complicating pregnancy, childbirth and the puerperium: Secondary | ICD-10-CM | POA: Diagnosis not present

## 2017-06-01 DIAGNOSIS — S299XXA Unspecified injury of thorax, initial encounter: Secondary | ICD-10-CM | POA: Diagnosis not present

## 2017-06-01 LAB — URINALYSIS, COMPLETE (UACMP) WITH MICROSCOPIC
BACTERIA UA: NONE SEEN
Bilirubin Urine: NEGATIVE
Glucose, UA: NEGATIVE mg/dL
Hgb urine dipstick: NEGATIVE
Ketones, ur: NEGATIVE mg/dL
LEUKOCYTES UA: NEGATIVE
Nitrite: NEGATIVE
PH: 6 (ref 5.0–8.0)
Protein, ur: NEGATIVE mg/dL
SPECIFIC GRAVITY, URINE: 1.019 (ref 1.005–1.030)

## 2017-06-01 LAB — POC URINE PREG, ED: PREG TEST UR: POSITIVE — AB

## 2017-06-01 MED ORDER — ACETAMINOPHEN 500 MG PO TABS
1000.0000 mg | ORAL_TABLET | Freq: Once | ORAL | Status: AC
  Administered 2017-06-01: 1000 mg via ORAL
  Filled 2017-06-01: qty 2

## 2017-06-01 MED ORDER — OXYCODONE-ACETAMINOPHEN 5-325 MG PO TABS
1.0000 | ORAL_TABLET | Freq: Once | ORAL | Status: DC
Start: 2017-06-01 — End: 2017-06-01
  Filled 2017-06-01: qty 1
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
## 2017-06-02 ENCOUNTER — Emergency Department (HOSPITAL_COMMUNITY): Payer: BLUE CROSS/BLUE SHIELD

## 2017-06-02 ENCOUNTER — Encounter (HOSPITAL_COMMUNITY): Payer: Self-pay | Admitting: Radiology

## 2017-06-02 DIAGNOSIS — M542 Cervicalgia: Secondary | ICD-10-CM | POA: Diagnosis not present

## 2017-06-02 DIAGNOSIS — M549 Dorsalgia, unspecified: Secondary | ICD-10-CM | POA: Diagnosis not present

## 2017-06-02 DIAGNOSIS — O26891 Other specified pregnancy related conditions, first trimester: Secondary | ICD-10-CM | POA: Diagnosis not present

## 2017-06-02 DIAGNOSIS — S199XXA Unspecified injury of neck, initial encounter: Secondary | ICD-10-CM | POA: Diagnosis not present

## 2017-06-02 DIAGNOSIS — S299XXA Unspecified injury of thorax, initial encounter: Secondary | ICD-10-CM | POA: Diagnosis not present

## 2017-06-02 LAB — HCG, QUANTITATIVE, PREGNANCY: HCG, BETA CHAIN, QUANT, S: 313 m[IU]/mL — AB (ref <5)

## 2017-06-02 MED ORDER — PRENATAL 19 29-1 MG PO TABS: 1.0000 | ORAL_TABLET | Freq: Every day | ORAL | 2 refills | Status: DC

## 2017-06-02 NOTE — Discharge Instructions (Signed)
Please call the women's clinic to schedule an appointment for prenatal care.

## 2017-06-04 ENCOUNTER — Encounter: Payer: Self-pay | Admitting: Obstetrics and Gynecology

## 2017-06-04 ENCOUNTER — Ambulatory Visit (INDEPENDENT_AMBULATORY_CARE_PROVIDER_SITE_OTHER): Payer: BLUE CROSS/BLUE SHIELD | Admitting: Obstetrics and Gynecology

## 2017-06-04 VITALS — Wt 160.0 lb

## 2017-06-04 DIAGNOSIS — Z3491 Encounter for supervision of normal pregnancy, unspecified, first trimester: Secondary | ICD-10-CM

## 2017-06-04 DIAGNOSIS — O2311 Infections of bladder in pregnancy, first trimester: Secondary | ICD-10-CM

## 2017-06-04 DIAGNOSIS — Z3490 Encounter for supervision of normal pregnancy, unspecified, unspecified trimester: Secondary | ICD-10-CM | POA: Insufficient documentation

## 2017-06-04 DIAGNOSIS — R8271 Bacteriuria: Secondary | ICD-10-CM

## 2017-06-04 DIAGNOSIS — Z3A01 Less than 8 weeks gestation of pregnancy: Secondary | ICD-10-CM
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
## 2017-06-06 LAB — URINE CULTURE

## 2017-06-07 DIAGNOSIS — O2311 Infections of bladder in pregnancy, first trimester: Secondary | ICD-10-CM | POA: Insufficient documentation

## 2017-06-07 DIAGNOSIS — R8271 Bacteriuria: Secondary | ICD-10-CM | POA: Insufficient documentation

## 2017-06-07 LAB — IGP,CTNGTV,RFX APTIMA HPV ASCU
Chlamydia, Nuc. Acid Amp: NEGATIVE
GONOCOCCUS, NUC. ACID AMP: NEGATIVE
PAP Smear Comment: 0
Trich vag by NAA: NEGATIVE

## 2017-06-07 MED ORDER — CEPHALEXIN 500 MG PO CAPS: 500.0000 mg | ORAL_CAPSULE | Freq: Four times a day (QID) | ORAL | 0 refills | Status: DC

## 2017-06-07 NOTE — Addendum Note (Signed)
Addended by: Thomasene Mohair D on: 06/07/2017 07:41 AM   Modules accepted: Orders

## 2017-06-10 LAB — RPR+RH+ABO+RUB AB+AB SCR+CB...
HEMATOCRIT: 37.1 % (ref 34.0–46.6)
HEMOGLOBIN: 12.6 g/dL (ref 11.1–15.9)
HIV Screen 4th Generation wRfx: NONREACTIVE
Hepatitis B Surface Ag: NEGATIVE
MCH: 31.8 pg (ref 26.6–33.0)
MCHC: 34 g/dL (ref 31.5–35.7)
MCV: 94 fL (ref 79–97)
Platelets: 289 10*3/uL (ref 150–379)
RBC: 3.96 x10E6/uL (ref 3.77–5.28)
RDW: 12.8 % (ref 12.3–15.4)
RH TYPE: POSITIVE
RPR Ser Ql: NONREACTIVE
Rubella Antibodies, IGG: 2.19 index (ref >0.99)
Varicella zoster IgG: 873 index (ref >165)
WBC: 10.1 10*3/uL (ref 3.4–10.8)

## 2017-06-10 LAB — HEMOGLOBINOPATHY EVALUATION
HGB A: 97.3 % (ref 96.4–98.8)
HGB C: 0 %
HGB S: 0 %
HGB VARIANT: 0 %
Hemoglobin A2 Quantitation: 2.7 % (ref 1.8–3.2)
Hemoglobin F Quantitation: 0 % (ref 0.0–2.0)

## 2017-06-10 LAB — AB SCR+ANTIBODY ID: ANTIBODY SCREEN: POSITIVE — AB

## 2017-06-25 ENCOUNTER — Ambulatory Visit (INDEPENDENT_AMBULATORY_CARE_PROVIDER_SITE_OTHER): Payer: BLUE CROSS/BLUE SHIELD | Admitting: Obstetrics and Gynecology

## 2017-06-25 ENCOUNTER — Ambulatory Visit (INDEPENDENT_AMBULATORY_CARE_PROVIDER_SITE_OTHER): Payer: BLUE CROSS/BLUE SHIELD

## 2017-06-25 VITALS — BP 118/74 | Wt 159.0 lb

## 2017-06-25 DIAGNOSIS — Z3491 Encounter for supervision of normal pregnancy, unspecified, first trimester: Secondary | ICD-10-CM | POA: Diagnosis not present

## 2017-06-25 DIAGNOSIS — Z362 Encounter for other antenatal screening follow-up: Secondary | ICD-10-CM | POA: Diagnosis not present

## 2017-06-25 NOTE — Progress Notes (Signed)
Routine Prenatal Care Visit  Subjective  Colleen Peterson is a 26 y.o. G3P0020 at 7423w6d being seen today for ongoing prenatal care.  She is currently monitored for the following issues for this low-risk pregnancy and has Cystic acne vulgaris; Supervision of low-risk pregnancy; GBS bacteriuria; and Acute cystitis during pregnancy in first trimester on her problem list.  ----------------------------------------------------------------------------------- Patient reports headache and nausea.    . Vag. Bleeding: None.   . Denies leaking of fluid.  U/S confirms EDD.  Discussed management of headache in first trimester with tylenol and possibly a combination of compazine and benadryl.  For nausea, discussed Bonjesta or conservative home measures. ----------------------------------------------------------------------------------- The following portions of the patient's history were reviewed and updated as appropriate: allergies, current medications, past family history, past medical history, past social history, past surgical history and problem list. Problem list updated.   Objective  Blood pressure 118/74, weight 159 lb (72.1 kg), last menstrual period 05/04/2017, unknown if currently breastfeeding. Pregravid weight 160 lb (72.6 kg) Total Weight Gain -1 lb (-0.454 kg) Urinalysis:      Fetal Status: Fetal Heart Rate (bpm): present         General:  Alert, oriented and cooperative. Patient is in no acute distress.  Skin: Skin is warm and dry. No rash noted.   Cardiovascular: Normal heart rate noted  Respiratory: Normal respiratory effort, no problems with respiration noted  Abdomen: Soft, gravid, appropriate for gestational age. Pain/Pressure: Absent     Pelvic:  Cervical exam deferred        Extremities: Normal range of motion.     Mental Status: Normal mood and affect. Normal behavior. Normal judgment and thought content.   Assessment   26 y.o. G3P0020 at 6123w6d by  02/08/2018, by Last  Menstrual Period presenting for routine prenatal visit  Plan   pregnancy Problems (from 06/04/17 to present)    Problem Noted Resolved   GBS bacteriuria 06/07/2017 by Conard NovakJackson, Milinda Sweeney D, MD No   Overview Signed 06/07/2017  7:39 AM by Conard NovakJackson, Markan Cazarez D, MD    At [facility]. Will need GBS prophylaxis in labor      Acute cystitis during pregnancy in first trimester 06/07/2017 by Conard NovakJackson, Fernando Stoiber D, MD No   Overview Signed 06/07/2017  7:40 AM by Conard NovakJackson, Judy Goodenow D, MD    [facility] both with keflex      Supervision of low-risk pregnancy 06/04/2017 by Conard NovakJackson, Rainah Kirshner D, MD No   Overview Addendum 06/25/2017  3:52 PM by Conard NovakJackson, Moon Budde D, MD    [facility]inic [facility] Prenatal Labs  Dating L=7 Blood type: O/Positive/-- (10/02 1512)   Genetic Screen 1 Screen: desires    AFP:     Quad:     NIPS: Antibody:Positive, See Final Results (10/02 1512)  Anatomic US  Rubella: Immune  Varicella:  Immune  GTT Early:               Third trimester:  RPR: Non Reactive (10/02 1512)   Rhogam  HBsAg: Negative (10/02 1512)   TDaP vaccine                       Flu Shot: HIV:   Negative  Baby Food                                GBS:   Contraception  Pap:  CBB   SS  screen: negative  CS/VBAC    Support Person           Please refer to After Visit Summary for other counseling recommendations.   Return in about 4 weeks (around 07/23/2017) for schedule u/s for NT and routine prenatal after.  Thomasene Mohair, MD  06/28/2017 11:21 AM

## 2017-06-27 ENCOUNTER — Encounter: Payer: BLUE CROSS/BLUE SHIELD | Admitting: Obstetrics and Gynecology

## 2017-07-23 ENCOUNTER — Encounter: Payer: Self-pay | Admitting: Obstetrics and Gynecology

## 2017-07-23 ENCOUNTER — Ambulatory Visit (INDEPENDENT_AMBULATORY_CARE_PROVIDER_SITE_OTHER): Payer: BLUE CROSS/BLUE SHIELD | Admitting: Obstetrics and Gynecology

## 2017-07-23 ENCOUNTER — Ambulatory Visit (INDEPENDENT_AMBULATORY_CARE_PROVIDER_SITE_OTHER): Payer: BLUE CROSS/BLUE SHIELD

## 2017-07-23 VITALS — BP 116/74 | Wt 158.0 lb

## 2017-07-23 DIAGNOSIS — Z3491 Encounter for supervision of normal pregnancy, unspecified, first trimester: Secondary | ICD-10-CM | POA: Diagnosis not present

## 2017-07-23 DIAGNOSIS — Z3A11 11 weeks gestation of pregnancy: Secondary | ICD-10-CM

## 2017-07-23 DIAGNOSIS — O2311 Infections of bladder in pregnancy, first trimester: Secondary | ICD-10-CM | POA: Diagnosis not present

## 2017-07-23 DIAGNOSIS — Z1379 Encounter for other screening for genetic and chromosomal anomalies: Secondary | ICD-10-CM

## 2017-07-23 DIAGNOSIS — Z362 Encounter for other antenatal screening follow-up: Secondary | ICD-10-CM | POA: Diagnosis not present

## 2017-07-23 DIAGNOSIS — Z23 Encounter for immunization: Secondary | ICD-10-CM | POA: Diagnosis not present

## 2017-07-23 DIAGNOSIS — R8271 Bacteriuria: Secondary | ICD-10-CM

## 2017-07-23 NOTE — Progress Notes (Signed)
Routine Prenatal Care Visit  Subjective  Colleen Peterson is a 26 y.o. G3P0020 at 3130w3d being seen today for ongoing prenatal care.  She is currently monitored for the following issues for this low-risk pregnancy and has Cystic acne vulgaris; Supervision of low-risk pregnancy; GBS bacteriuria; and Acute cystitis during pregnancy in first trimester on their problem list.  ----------------------------------------------------------------------------------- Patient reports no complaints.    . Vag. Bleeding: None.   . Denies leaking of fluid.  NT screen today Offered flu vaccine today. ----------------------------------------------------------------------------------- The following portions of the patient's history were reviewed and updated as appropriate: allergies, current medications, past family history, past medical history, past social history, past surgical history and problem list. Problem list updated.   Objective  Blood pressure 116/74, weight 158 lb (71.7 kg), last menstrual period 05/04/2017, unknown if currently breastfeeding. Pregravid weight 160 lb (72.6 kg) Total Weight Gain  (-0.907 kg) Urinalysis: Urine Protein: Negative Urine Glucose: Negative  Fetal Status: Fetal Heart Rate (bpm): Present         General:  Alert, oriented and cooperative. Patient is in no acute distress.  Skin: Skin is warm and dry. No rash noted.   Cardiovascular: Normal heart rate noted  Respiratory: Normal respiratory effort, no problems with respiration noted  Abdomen: Soft, gravid, appropriate for gestational age. Pain/Pressure: Absent     Pelvic:  Cervical exam deferred        Extremities: Normal range of motion.     Mental Status: Normal mood and affect. Normal behavior. Normal judgment and thought content.   Assessment   26 y.o. G3P0020 at 9130w3d by  02/08/2018, by Last Menstrual Period presenting for routine prenatal visit  Plan   pregnancy Problems (from 06/04/17 to present)    Problem  Noted Resolved   GBS bacteriuria 06/07/2017 by Conard NovakJackson, Edina Winningham D, MD No   Overview Signed 06/07/2017  7:39 AM by Conard NovakJackson, Cystal Shannahan D, MD    At [facility]. Will need GBS prophylaxis in labor [ ]  TOC today      Acute cystitis during pregnancy in first trimester 06/07/2017 by Conard NovakJackson, Tarissa Kerin D, MD No   Overview Signed 06/07/2017  7:40 AM by Conard NovakJackson, Danea Manter D, MD    [facility] both with keflex [ ]  TOC today      Supervision of low-risk pregnancy 06/04/2017 by Conard NovakJackson, Adalie Mand D, MD No   Overview Addendum 06/25/2017  3:52 PM by Conard NovakJackson, Kamali Nephew D, MD    [facility]inic [facility] Prenatal Labs  Dating L=7 Blood type: O/Positive/-- (10/02 1512)   Genetic Screen 1 Screen: desires    AFP:     Quad:     NIPS: Antibody:Positive, See Final Results (10/02 1512)  Anatomic US  Rubella: Immune  Varicella:  Immune  GTT Early:               Third trimester:  RPR: Non Reactive (10/02 1512)   Rhogam  HBsAg: Negative (10/02 1512)   TDaP vaccine                       Flu Shot: HIV:   Negative  Baby Food                                GBS: +bacteriuria at [facility]  Contraception  Pap:  CBB   SS screen: negative  CS/VBAC    Support Person  Please refer to After Visit Summary for other counseling recommendations.   Return in about 4 weeks (around 08/20/2017) for Routine Prenatal Appointment.  Thomasene MohairStephen Chirsty Armistead, MD  07/23/2017 2:41 PM

## 2017-07-25 LAB — URINE CULTURE: Organism ID, Bacteria: NO GROWTH

## 2017-07-31 LAB — FIRST TRIMESTER SCREEN W/NT
CRL: 48 mm
DIA MoM: 0.71
DIA VALUE: 194.6 pg/mL
GEST AGE-COLLECT: 11.6 wk
HCG VALUE: 34.6 [IU]/mL
MATERNAL AGE AT EDD: 26.6 a
NUCHAL TRANSLUCENCY MOM: 0.83
NUMBER OF FETUSES: 1
Nuchal Translucency: 1.1 mm
PAPP-A MoM: 1.6
PAPP-A Value: 976.3 ng/mL
TEST RESULTS: NEGATIVE
Weight: 158 [lb_av]
hCG MoM: 0.34

## 2017-08-01 ENCOUNTER — Telehealth: Payer: Self-pay

## 2017-08-01 NOTE — Telephone Encounter (Signed)
-----   Message from Conard NovakStephen D Jackson, MD sent at 07/31/2017  6:02 PM EST ----- Please call patient and let her know that her first trimester screen was negative.  Thank you!

## 2017-08-01 NOTE — Telephone Encounter (Signed)
Pt aware via vm . Let me know if she calls back

## 2017-08-02 ENCOUNTER — Ambulatory Visit (INDEPENDENT_AMBULATORY_CARE_PROVIDER_SITE_OTHER): Payer: BLUE CROSS/BLUE SHIELD | Admitting: Obstetrics and Gynecology

## 2017-08-02 VITALS — BP 118/82 | Wt 158.0 lb

## 2017-08-02 DIAGNOSIS — R8271 Bacteriuria: Secondary | ICD-10-CM | POA: Diagnosis not present

## 2017-08-02 DIAGNOSIS — Z3A13 13 weeks gestation of pregnancy: Secondary | ICD-10-CM | POA: Diagnosis not present

## 2017-08-02 DIAGNOSIS — Z3491 Encounter for supervision of normal pregnancy, unspecified, first trimester: Secondary | ICD-10-CM | POA: Diagnosis not present

## 2017-08-02 DIAGNOSIS — G43909 Migraine, unspecified, not intractable, without status migrainosus: Secondary | ICD-10-CM | POA: Insufficient documentation

## 2017-08-02 DIAGNOSIS — O2311 Infections of bladder in pregnancy, first trimester: Secondary | ICD-10-CM | POA: Diagnosis not present

## 2017-08-02 DIAGNOSIS — G43009 Migraine without aura, not intractable, without status migrainosus: Secondary | ICD-10-CM | POA: Diagnosis not present

## 2017-08-02 MED ORDER — PROCHLORPERAZINE MALEATE 10 MG PO TABS: 10.0000 mg | ORAL_TABLET | Freq: Three times a day (TID) | ORAL | 0 refills | Status: DC | PRN

## 2017-08-02 NOTE — Progress Notes (Signed)
Routine Prenatal Care Visit  Subjective  Colleen Peterson is a 26 y.o. G3P0020 at 7358w0d being seen today for ongoing prenatal care.  She is currently monitored for the following issues for this low-risk pregnancy and has Cystic acne vulgaris; Supervision of low-risk pregnancy; GBS bacteriuria; Acute cystitis during pregnancy in first trimester; and Migraine on their problem list.  ----------------------------------------------------------------------------------- Patient reports headaches intractable to tylenol. She feels the headache mostly behind her right eye. The pain does not radiate. She does have light and sound sensitivity.  She denies aura.  She denies sudden onset and tearing.  She denies neck pain. She denies visual disturbances and and other neurological symptoms such as weakness, tingling, loss of coordination, loss of sensation.   . Vag. Bleeding: None.   . Denies leaking of fluid.  ----------------------------------------------------------------------------------- The following portions of the patient's history were reviewed and updated as appropriate: allergies, current medications, past family history, past medical history, past social history, past surgical history and problem list. Problem list updated.  Objective  Blood pressure 118/82, weight 158 lb (71.7 kg), last menstrual period 05/04/2017, unknown if currently breastfeeding. Pregravid weight 160 lb (72.6 kg) Total Weight Gain  (-0.907 kg) Urinalysis: Urine Protein: Negative Urine Glucose: Negative  Fetal Status: Fetal Heart Rate (bpm): present         General:  Alert, oriented and cooperative. Patient is in no acute distress.  Skin: Skin is warm and dry. No rash noted.   Cardiovascular: Normal heart rate noted  Respiratory: Normal respiratory effort, no problems with respiration noted  Abdomen: Soft, gravid, appropriate for gestational age. Pain/Pressure: Absent     Pelvic:  Cervical exam deferred         Extremities: Normal range of motion.     Mental Status: Normal mood and affect. Normal behavior. Normal judgment and thought content.   Assessment   26 y.o. G3P0020 at 6458w0d by  02/08/2018, by Last Menstrual Period presenting for work-in prenatal visit  Plan   pregnancy Problems (from 06/04/17 to present)    Problem Noted Resolved   Migraine 08/02/2017 by Conard NovakJackson, Cordelro Gautreau D, MD No   Overview Signed 08/03/2017 12:52 PM by Conard NovakJackson, Larron Armor D, MD    Will treat initially with combination of compazine and benadryl.  Further treatment depending on response.       GBS bacteriuria 06/07/2017 by Conard NovakJackson, Demetrie Borge D, MD No   Overview Signed 06/07/2017  7:39 AM by Conard NovakJackson, Itxel Wickard D, MD    At [facility]. Will need GBS prophylaxis in labor      Acute cystitis during pregnancy in first trimester 06/07/2017 by Conard NovakJackson, Melanny Wire D, MD No   Overview Signed 06/07/2017  7:40 AM by Conard NovakJackson, Hannah Crill D, MD    [facility] tx'd both with keflex      Supervision of low-risk pregnancy 06/04/2017 by Conard NovakJackson, Feven Alderfer D, MD No   Overview Addendum 07/31/2017  6:03 PM by Conard NovakJackson, Fadi Menter D, MD    [facility] Prenatal Labs  Dating L=7 Blood type: O/Positive/-- (10/02 1512)   Genetic Screen 1 Screen: neg     AFP:  [ ]  offer 16-18 weeks    Antibody:Positive, See Final Results (10/02 1512)  Anatomic US  Rubella: Immune  Varicella:  Immune  GTT Early:               Third trimester:  RPR: Non Reactive (10/02 1512)   Rhogam  HBsAg: Negative (10/02 1512)   TDaP vaccine  Flu Shot: HIV:   Negative  Baby Food                                GBS:   Contraception  Pap:  CBB   SS screen: negative  CS/VBAC    Support Person           Please refer to After Visit Summary for other counseling recommendations.   Return if symptoms worsen or fail to improve.  Thomasene MohairStephen Omid Deardorff, MD  08/03/2017 12:53 PM

## 2017-08-03 ENCOUNTER — Encounter: Payer: Self-pay | Admitting: Obstetrics and Gynecology

## 2017-08-20 ENCOUNTER — Ambulatory Visit (INDEPENDENT_AMBULATORY_CARE_PROVIDER_SITE_OTHER): Payer: BLUE CROSS/BLUE SHIELD | Admitting: Obstetrics and Gynecology

## 2017-08-20 ENCOUNTER — Encounter: Payer: Self-pay | Admitting: Obstetrics and Gynecology

## 2017-08-20 VITALS — BP 116/74 | Wt 166.0 lb

## 2017-08-20 DIAGNOSIS — Z3492 Encounter for supervision of normal pregnancy, unspecified, second trimester: Secondary | ICD-10-CM | POA: Diagnosis not present

## 2017-08-20 DIAGNOSIS — R8271 Bacteriuria: Secondary | ICD-10-CM

## 2017-08-20 NOTE — Progress Notes (Signed)
Routine Prenatal Care Visit  Subjective  Colleen Peterson is a 26 y.o. G3P0020 at 9655w3d being seen today for ongoing prenatal care.  She is currently monitored for the following issues for this low-risk pregnancy and has Cystic acne vulgaris; Supervision of low-risk pregnancy; GBS bacteriuria; Acute cystitis during pregnancy in first trimester; and Migraine on their problem list.  ----------------------------------------------------------------------------------- Patient reports no complaints.    . Vag. Bleeding: None.   . Denies leaking of fluid. ----------------------------------------------------------------------------------- The following portions of the patient's history were reviewed and updated as appropriate: allergies, current medications, past family history, past medical history, past social history, past surgical history and problem list. Problem list updated.   Objective  Blood pressure 116/74, weight 166 lb (75.3 kg), last menstrual period 05/04/2017, unknown if currently breastfeeding. Pregravid weight 160 lb (72.6 kg) Total Weight Gain 6 lb (2.722 kg) Urinalysis: Urine Protein: Negative Urine Glucose: Negative  Fetal Status: Fetal Heart Rate (bpm): present         General:  Alert, oriented and cooperative. Patient is in no acute distress.  Skin: Skin is warm and dry. No rash noted.   Cardiovascular: Normal heart rate noted  Respiratory: Normal respiratory effort, no problems with respiration noted  Abdomen: Soft, gravid, appropriate for gestational age. Pain/Pressure: Absent     Pelvic:  Cervical exam deferred        Extremities: Normal range of motion.     Mental Status: Normal mood and affect. Normal behavior. Normal judgment and thought content.   Assessment   26 y.o. G3P0020 at 5955w3d by  02/08/2018, by Last Menstrual Period presenting for routine prenatal visit  Plan   pregnancy Problems (from 06/04/17 to present)    Problem Noted Resolved   Migraine  08/02/2017 by Conard NovakJackson, Kameko Hukill D, MD No   Overview Signed 08/03/2017 12:52 PM by Conard NovakJackson, Claudell Rhody D, MD    Will treat initially with combination of compazine and benadryl.  Further treatment depending on response.       GBS bacteriuria 06/07/2017 by Conard NovakJackson, Ross Bender D, MD No   Overview Signed 06/07/2017  7:39 AM by Conard NovakJackson, Izaih Kataoka D, MD    At [facility]. Will need GBS prophylaxis in labor      Acute cystitis during pregnancy in first trimester 06/07/2017 by Conard NovakJackson, Timarion Agcaoili D, MD No   Overview Signed 06/07/2017  7:40 AM by Conard NovakJackson, Johathon Overturf D, MD    [facility] tx'd both with keflex      Supervision of low-risk pregnancy 06/04/2017 by Conard NovakJackson, Jerita Wimbush D, MD No   Overview Addendum 07/31/2017  6:03 PM by Conard NovakJackson, Sanaiya Welliver D, MD    [facility] Prenatal Labs  Dating L=7 Blood type: O/Positive/-- (10/02 1512)   Genetic Screen 1 Screen: neg     AFP:  [x]  offer 16-18 weeks - considering   Antibody:Positive, See Final Results (10/02 1512)  Anatomic US  Rubella: Immune  Varicella:  Immune  GTT Early:               Third trimester:  RPR: Non Reactive (10/02 1512)   Rhogam  HBsAg: Negative (10/02 1512)   TDaP vaccine                       Flu Shot: HIV:   Negative  Baby Food  GBS:   Contraception  Pap:  CBB   SS screen: negative  CS/VBAC    Support Person            Preterm labor symptoms and general obstetric precautions including but not limited to vaginal bleeding, contractions, leaking of fluid and fetal movement were reviewed in detail with the patient. Please refer to After Visit Summary for other counseling recommendations.   Return in about 4 weeks (around 09/17/2017) for anatomy u/s and routine prenatal.  -msAFP offered today - repeat antibody screen today  Thomasene MohairStephen Alaisha Eversley, MD  08/20/2017 3:48 PM

## 2017-08-21 LAB — ABO/RH: Rh Factor: POSITIVE

## 2017-09-17 ENCOUNTER — Encounter: Payer: Self-pay | Admitting: Obstetrics and Gynecology

## 2017-09-17 ENCOUNTER — Ambulatory Visit (INDEPENDENT_AMBULATORY_CARE_PROVIDER_SITE_OTHER): Payer: Medicaid Other

## 2017-09-17 ENCOUNTER — Ambulatory Visit (INDEPENDENT_AMBULATORY_CARE_PROVIDER_SITE_OTHER): Payer: Medicaid Other | Admitting: Obstetrics and Gynecology

## 2017-09-17 VITALS — BP 118/82 | Wt 168.0 lb

## 2017-09-17 DIAGNOSIS — Z3492 Encounter for supervision of normal pregnancy, unspecified, second trimester: Secondary | ICD-10-CM

## 2017-09-17 DIAGNOSIS — Z362 Encounter for other antenatal screening follow-up: Secondary | ICD-10-CM

## 2017-09-17 DIAGNOSIS — Z3A19 19 weeks gestation of pregnancy: Secondary | ICD-10-CM

## 2017-09-17 DIAGNOSIS — R8271 Bacteriuria: Secondary | ICD-10-CM

## 2017-09-17 NOTE — Progress Notes (Signed)
Routine Prenatal Care Visit  Subjective  Colleen Peterson is a 27 y.o. G3P0020 at 699w3d being seen today for ongoing prenatal care.  She is currently monitored for the following issues for this low-risk pregnancy and has Cystic acne vulgaris; Supervision of low-risk pregnancy; GBS bacteriuria; Acute cystitis during pregnancy in first trimester; and Migraine on their problem list.  ----------------------------------------------------------------------------------- Patient reports no complaints.    . Vag. Bleeding: None.  Movement: Present. Denies leaking of fluid.  Anatomy u/s complete.  Gender is a surprise.  ----------------------------------------------------------------------------------- The following portions of the patient's history were reviewed and updated as appropriate: allergies, current medications, past family history, past medical history, past social history, past surgical history and problem list. Problem list updated.   Objective  Blood pressure 118/82, weight 168 lb (76.2 kg), last menstrual period 05/04/2017, unknown if currently breastfeeding. Pregravid weight 160 lb (72.6 kg) Total Weight Gain 8 lb (3.629 kg) Urinalysis: Urine Protein: Negative Urine Glucose: Negative  Fetal Status: Fetal Heart Rate (bpm): present   Movement: Present    General:  Alert, oriented and cooperative. Patient is in no acute distress.  Skin: Skin is warm and dry. No rash noted.   Cardiovascular: Normal heart rate noted  Respiratory: Normal respiratory effort, no problems with respiration noted  Abdomen: Soft, gravid, appropriate for gestational age. Pain/Pressure: Absent     Pelvic:  Cervical exam deferred        Extremities: Normal range of motion.     Mental Status: Normal mood and affect. Normal behavior. Normal judgment and thought content.   Assessment   27 y.o. G3P0020 at 629w3d by  02/08/2018, by Last Menstrual Period presenting for routine prenatal visit  Plan   pregnancy  Problems (from 06/04/17 to present)    Problem Noted Resolved   Migraine 08/02/2017 by Conard NovakJackson, Harman Langhans D, MD No   Overview Signed 08/03/2017 12:52 PM by Conard NovakJackson, Rayma Hegg D, MD    Will treat initially with combination of compazine and benadryl.  Further treatment depending on response.       GBS bacteriuria 06/07/2017 by Conard NovakJackson, Marygrace Sandoval D, MD No   Overview Signed 06/07/2017  7:39 AM by Conard NovakJackson, Corbyn Steedman D, MD    At [facility]. Will need GBS prophylaxis in labor      Acute cystitis during pregnancy in first trimester 06/07/2017 by Conard NovakJackson, Brylee Berk D, MD No   Overview Signed 06/07/2017  7:40 AM by Conard NovakJackson, Tracye Szuch D, MD    [facility] - tx'd both with keflex      Supervision of low-risk pregnancy 06/04/2017 by Conard NovakJackson, Maude Gloor D, MD No   Overview Addendum 09/17/2017 11:13 AM by Conard NovakJackson, Sundee Garland D, MD    Clinic [facility] Prenatal Labs  Dating L=7 Blood type: O/Positive/-- (10/02 1512)   Genetic Screen 1 Screen: neg     AFP:  [ ]  offer 16-18 weeks    Antibody:Positive, See Final Results (10/02 1512)  Anatomic US Complete @ 19 wks Rubella: Immune  Varicella:  Immune  GTT Third trimester:  RPR: Non Reactive (10/02 1512)   Rhogam  HBsAg: Negative (10/02 1512)   TDaP vaccine                       Flu Shot: HIV:   Negative  Baby Food  GBS:   Contraception  Pap:  CBB   SS screen: negative  CS/VBAC    Support Person            Preterm labor symptoms and general obstetric precautions including but not limited to vaginal bleeding, contractions, leaking of fluid and fetal movement were reviewed in detail with the patient. Please refer to After Visit Summary for other counseling recommendations.   Return in about 4 weeks (around 10/15/2017) for Routine Prenatal Appointment.  Thomasene Mohair, MD  09/17/2017 11:19 AM

## 2017-10-15 ENCOUNTER — Ambulatory Visit (INDEPENDENT_AMBULATORY_CARE_PROVIDER_SITE_OTHER): Payer: Medicaid Other | Admitting: Maternal Newborn

## 2017-10-15 VITALS — BP 104/60 | Wt 174.0 lb

## 2017-10-15 DIAGNOSIS — R12 Heartburn: Secondary | ICD-10-CM

## 2017-10-15 DIAGNOSIS — O26892 Other specified pregnancy related conditions, second trimester: Secondary | ICD-10-CM

## 2017-10-15 DIAGNOSIS — Z3492 Encounter for supervision of normal pregnancy, unspecified, second trimester: Secondary | ICD-10-CM

## 2017-10-15 DIAGNOSIS — O26893 Other specified pregnancy related conditions, third trimester: Secondary | ICD-10-CM | POA: Insufficient documentation

## 2017-10-15 MED ORDER — RANITIDINE HCL 75 MG PO TABS: 75.0000 mg | ORAL_TABLET | Freq: Two times a day (BID) | ORAL | 3 refills | Status: DC

## 2017-10-15 NOTE — Progress Notes (Signed)
C/o lot of heartburn; needs citranatal harmony.rj

## 2017-10-16 ENCOUNTER — Encounter: Payer: Self-pay | Admitting: Maternal Newborn

## 2017-10-16 NOTE — Progress Notes (Signed)
Routine Prenatal Care Visit  Subjective  Colleen Peterson is a 27 y.o. G3P0020 at 7441w4d being seen today for ongoing prenatal care.  She is currently monitored for the following issues for this low-risk pregnancy and has Cystic acne vulgaris; Supervision of low-risk pregnancy; GBS bacteriuria; Acute cystitis during pregnancy in first trimester; Migraine; and Heartburn during pregnancy in second trimester on their problem list.  ----------------------------------------------------------------------------------- Patient reports heartburn.   Vag. Bleeding: None.  Movement: Present. Denies leaking of fluid.  ----------------------------------------------------------------------------------- The following portions of the patient's history were reviewed and updated as appropriate: allergies, current medications, past family history, past medical history, past social history, past surgical history and problem list. Problem list updated.   Objective  Blood pressure 104/60, weight 174 lb (78.9 kg), last menstrual period 05/04/2017, unknown if currently breastfeeding. Pregravid weight 160 lb (72.6 kg) Total Weight Gain 14 lb (6.35 kg) Urinalysis: Urine Protein: Negative Urine Glucose: Negative  Fetal Status: Fetal Heart Rate (bpm): 140   Movement: Present     General:  Alert, oriented and cooperative. Patient is in no acute distress.  Skin: Skin is warm and dry. No rash noted.   Cardiovascular: Normal heart rate noted  Respiratory: Normal respiratory effort, no problems with respiration noted  Abdomen: Soft, gravid, appropriate for gestational age. Pain/Pressure: Absent     Pelvic:  Cervical exam deferred        Extremities: Normal range of motion.     Mental Status: Normal mood and affect. Normal behavior. Normal judgment and thought content.     Assessment   27 y.o. G3P0020 at 6341w4d, EDD 02/08/2018 by Last Menstrual Period presenting for routine prenatal visit.  Plan   pregnancy  Problems (from 06/04/17 to present)    Problem Noted Resolved   Migraine 08/02/2017 by Conard NovakJackson, Stephen D, MD No   Overview Signed 08/03/2017 12:52 PM by Conard NovakJackson, Stephen D, MD    Will treat initially with combination of compazine and benadryl.  Further treatment depending on response.       GBS bacteriuria 06/07/2017 by Conard NovakJackson, Stephen D, MD No   Overview Signed 06/07/2017  7:39 AM by Conard NovakJackson, Stephen D, MD    At [facility]. Will need GBS prophylaxis in labor      Acute cystitis during pregnancy in first trimester 06/07/2017 by Conard NovakJackson, Stephen D, MD No   Overview Signed 06/07/2017  7:40 AM by Conard NovakJackson, Stephen D, MD    [facility] - tx'd both with keflex      Supervision of low-risk pregnancy 06/04/2017 by Conard NovakJackson, Stephen D, MD No   Overview Addendum 09/17/2017 11:13 AM by Conard NovakJackson, Stephen D, MD    [facility]inic [facility] Prenatal Labs  Dating L=7 Blood type: O/Positive/-- (10/02 1512)   Genetic Screen 1 Screen: neg     AFP:  [ ]  offer 16-18 weeks    Antibody:Positive, See Final Results (10/02 1512)  Anatomic US Complete @ 19 wks Rubella: Immune  Varicella:  Immune  GTT Third trimester:  RPR: Non Reactive (10/02 1512)   Rhogam  HBsAg: Negative (10/02 1512)   TDaP vaccine                       Flu Shot: HIV:   Negative  Baby Food                                GBS:  Contraception  Pap:  CBB   SS screen: negative  CS/VBAC    Support Person           Rx sent for Zantac for ongoing heartburn symptoms with occasional reflux. Samples given for Citranatal vitamins.  Preterm labor symptoms and general obstetric precautions including but not limited to vaginal bleeding, contractions, leaking of fluid and fetal movement were reviewed in detail with the patient.  Return in about 2 weeks (around 10/29/2017) for ROB.  [name], CNM 10/16/2017  7:52 PM

## 2017-10-29 ENCOUNTER — Encounter: Payer: Self-pay | Admitting: Obstetrics and Gynecology

## 2017-10-29 ENCOUNTER — Encounter: Payer: Medicaid Other | Admitting: Maternal Newborn

## 2017-10-29 ENCOUNTER — Ambulatory Visit (INDEPENDENT_AMBULATORY_CARE_PROVIDER_SITE_OTHER): Payer: Medicaid Other | Admitting: Obstetrics and Gynecology

## 2017-10-29 VITALS — BP 114/70 | Wt 176.0 lb

## 2017-10-29 DIAGNOSIS — Z131 Encounter for screening for diabetes mellitus: Secondary | ICD-10-CM

## 2017-10-29 DIAGNOSIS — Z130 Encounter for screening for diseases of the blood and blood-forming organs and certain disorders involving the immune mechanism: Secondary | ICD-10-CM

## 2017-10-29 DIAGNOSIS — Z3A25 25 weeks gestation of pregnancy: Secondary | ICD-10-CM

## 2017-10-29 MED ORDER — PRENATAL 19 29-1 MG PO TABS: 1.0000 | ORAL_TABLET | Freq: Every day | ORAL | 2 refills | Status: DC

## 2017-10-29 NOTE — Progress Notes (Signed)
  Subjective  Fetal Movement? yes Contractions? no Leaking Fluid? no Vaginal Bleeding? No PNVs? yes  Objective  BP 114/70   Wt 176 lb (79.8 kg)   LMP 05/04/2017   BMI 30.21 kg/m  General: NAD Pulmonary: no increased work of breathing Abdomen: gravid, non-tender Extremities: no edema Psychiatric: mood appropriate, affect full  Assessment  27 y.o. G3P0020 at 1922w3d by  02/08/2018, by Last Menstrual Period presenting for routine prenatal visit  Plan   Problem List Items Addressed This Visit    None    Visit Diagnoses    [redacted] weeks gestation of pregnancy    -  Primary    Rx RF PNVs  OSS nv--3 wks  Althea GrimmerAlicia Copland, PA-C [facility], [facility] 10/29/2017  2:34 PM

## 2017-10-29 NOTE — Progress Notes (Signed)
No vb. No lof.  

## 2017-10-29 NOTE — Addendum Note (Signed)
Addended by: Althea GrimmerOPLAND, Rivers Hamrick B on: 10/29/2017 02:39 PM   Modules accepted: Orders

## 2017-10-29 NOTE — Patient Instructions (Signed)
I value your feedback and entrusting us with your care. If you get a [facility] patient survey, I would appreciate you taking the time to let us know about your experience today. Thank you! 

## 2017-11-07 ENCOUNTER — Telehealth: Payer: Self-pay

## 2017-11-07 NOTE — Telephone Encounter (Signed)
Pt woke up this morning feeling dizzy but went to work anyway. After breakfast she vomited and has a headache in the back of her head. She has been sitting at work and still feeling a little dizzy. Also c/o pain on lower left side. Baby moving well, denies bleeding or LOF.  Advised rest and hydration. Tylenol for headache. Pt to contact office or go to L&D if sxs worsen or persist.

## 2017-11-08 ENCOUNTER — Ambulatory Visit (INDEPENDENT_AMBULATORY_CARE_PROVIDER_SITE_OTHER): Payer: Medicaid Other | Admitting: Obstetrics & Gynecology

## 2017-11-08 VITALS — BP 102/60 | Wt 176.0 lb

## 2017-11-08 DIAGNOSIS — R1030 Lower abdominal pain, unspecified: Secondary | ICD-10-CM | POA: Diagnosis not present

## 2017-11-08 DIAGNOSIS — O26899 Other specified pregnancy related conditions, unspecified trimester: Principal | ICD-10-CM

## 2017-11-08 DIAGNOSIS — O9989 Other specified diseases and conditions complicating pregnancy, childbirth and the puerperium: Secondary | ICD-10-CM | POA: Diagnosis not present

## 2017-11-08 DIAGNOSIS — B373 Candidiasis of vulva and vagina: Secondary | ICD-10-CM

## 2017-11-08 DIAGNOSIS — B3731 Acute candidiasis of vulva and vagina: Secondary | ICD-10-CM

## 2017-11-08 MED ORDER — TERCONAZOLE 0.4 % VA CREA: 1.0000 | TOPICAL_CREAM | Freq: Every day | VAGINAL | 0 refills | Status: DC

## 2017-11-08 NOTE — Patient Instructions (Signed)
Terconazole vaginal cream What is this medicine? TERCONAZOLE (ter KON a zole) is an antifungal medicine. It is used to treat yeast infections of the vagina. This medicine may be used for other purposes; ask your health care provider or pharmacist if you have questions. COMMON BRAND NAME(S): Terazol 3, Terazol 7, Zazole What should I tell my health care provider before I take this medicine? They need to know if you have any of these conditions: -an unusual or allergic reaction to terconazole, other antifungals, other medicines, foods, dyes or preservatives -pregnant or trying to get pregnant -breast-feeding How should I use this medicine? This medicine is only for use in the vagina. Do not take by mouth. Wash hands before and after use. Read package directions carefully before using. Use this medicine at bedtime, unless otherwise directed by your doctor or health care professional. Screw the applicator onto the end of the tube and squeeze the tube to fill the applicator. Remove the applicator from the tube. Lie on your back. Gently insert the applicator tip high in the vagina and push the plunger to release the cream into the vagina. Gently remove the applicator. Wash the applicator well with warm water and soap. Use at regular intervals. Do not get this medicine in your eyes. If you do, rinse out with plenty of cool tap water. Finish the full course prescribed by your doctor or health care professional even if you think your condition is better. Do not stop using this medicine if your menstrual period starts during the time of treatment. Talk to your pediatrician regarding the use of this medicine in children. Special care may be needed. Overdosage: If you think you have taken too much of this medicine contact a poison control center or emergency room at once. NOTE: This medicine is only for you. Do not share this medicine with others. What if I miss a dose? If you miss a dose, use it as soon as you  can. If it is almost time for your next dose, use only that dose. Do not use double or extra doses. What may interact with this medicine? Interactions are not expected. Do not use any other vaginal products without telling your doctor or health care professional. This list may not describe all possible interactions. Give your health care provider a list of all the medicines, herbs, non-prescription drugs, or dietary supplements you use. Also tell them if you smoke, drink alcohol, or use illegal drugs. Some items may interact with your medicine. What should I watch for while using this medicine? Tell your doctor or health care professional if your symptoms do not start to get better within a few days. It is better not to have sex until you have finished your treatment. If you have sex, your partner should use a condom during sex to help prevent transfer of the infection. Your sexual partner may also need treatment. Vaginal medicines usually will come out of the vagina during treatment. To keep the medicine from getting on your clothing, wear a mini-pad or sanitary napkin. The use of tampons is not recommended since they may soak up the medicine. To help clear up the infection, wear freshly washed cotton, not synthetic, underwear. What side effects may I notice from receiving this medicine? Side effects that you should report to your doctor or health care professional as soon as possible: -painful or difficult urination -vaginal pain Side effects that usually do not require medical attention (report to your doctor or health care professional if   they continue or are bothersome): -headache -menstrual pain -stomach upset -vaginal irritation, itching or burning This list may not describe all possible side effects. Call your doctor for medical advice about side effects. You may report side effects to FDA at 1-800-FDA-1088. Where should I keep my medicine? Keep out of the reach of children. Store at room  temperature between 15 and 30 degrees C (59 and 86 degrees F). Throw away any unused medicine after the expiration date. NOTE: This sheet is a summary. It may not cover all possible information. If you have questions about this medicine, talk to your doctor, pharmacist, or health care provider.  2018 Elsevier/Gold Standard (2008-05-05 13:51:27)  

## 2017-11-08 NOTE — Progress Notes (Signed)
HPI:      Colleen Peterson is a 27 y.o. G3P0020 who LMP was Patient's last menstrual period was 05/04/2017., presents today for a problem visit.  She is [redacted] weeks pregnant.  Urinary Tract Infection: Patient complains of pain in the lower abdomen and it has no radiation, is moderate, 2 days, resting makes better and activity makes it worse, assoc w nausea yesterday only; denies dysuria, f/c/d/c, VB, ROM.  Good FM  Denies vag d/c or itching or burning. . She has had symptoms for 2 days. Patient also complains of preg belly and groin pain. Patient denies back pain, fever, headache and vaginal discharge. Patient does not have a history of recurrent UTI.  Patient does not have a history of pyelonephritis.   PMHx: She  has a past medical history of BV (bacterial vaginosis) and Trichomoniasis (05/2017). Also,  has no past surgical history on file., family history includes Breast cancer in her maternal grandmother.,  reports that  has never smoked. she has never used smokeless tobacco. She reports that she drinks alcohol. She reports that she does not use drugs.  She has a current medication list which includes the following prescription(s): prenatal 19, prochlorperazine, and ranitidine. Also, has No Known Allergies.  Review of Systems  Constitutional: Negative for chills, fever and malaise/fatigue.  HENT: Negative for congestion, sinus pain and sore throat.   Eyes: Negative for blurred vision and pain.  Respiratory: Negative for cough and wheezing.   Cardiovascular: Negative for chest pain and leg swelling.  Gastrointestinal: Positive for abdominal pain. Negative for constipation, diarrhea, heartburn, nausea and vomiting.  Genitourinary: Negative for dysuria, frequency, hematuria and urgency.  Musculoskeletal: Negative for back pain, joint pain, myalgias and neck pain.  Skin: Negative for itching and rash.  Neurological: Negative for dizziness, tremors and weakness.  Endo/Heme/Allergies: Does not  bruise/bleed easily.  Psychiatric/Behavioral: Negative for depression. The patient is not nervous/anxious and does not have insomnia.    Objective: BP 102/60   Wt 176 lb (79.8 kg)   LMP 05/04/2017   BMI 30.21 kg/m  Physical Exam  Constitutional: She is oriented to person, place, and time. She appears well-developed and well-nourished. No distress.  Genitourinary: Vagina normal and uterus normal. Pelvic exam was performed with patient supine. There is no rash, tenderness or lesion on the right labia. There is no rash, tenderness or lesion on the left labia. No erythema or bleeding in the vagina. Right adnexum does not display mass and does not display tenderness. Left adnexum does not display mass and does not display tenderness. Cervix does not exhibit motion tenderness, discharge, polyp or nabothian cyst.   Uterus is mobile and midaxial. Uterus is not enlarged or exhibiting a mass.  Genitourinary Comments: Cervix not dilated. Vagina- d/c c/w yeast  Abdominal: Soft. She exhibits no distension. There is tenderness in the suprapubic area. There is no rebound, no guarding, no tenderness at McBurney's point and negative Murphy's sign.  Gravid, FHT 140s  Musculoskeletal: Normal range of motion.  Neurological: She is alert and oriented to person, place, and time. No cranial nerve deficit.  Skin: Skin is warm and dry.  Psychiatric: She has a normal mood and affect.   UA NEG  Microscopic wet-mount exam shows hyphae, no clue or trich.  ASSESSMENT/PLAN:   Vulvovaginitis and lower abdominal pain No sign of UTI Low likelihood for PTL [redacted] weeks pregnant  fFN pending Terazol Monitor for worsening s/sx pain or PTL.  Rest, hot shower/bath, Tylenol.  Renae Fickle  Tiburcio PeaHarris, MD, Merlinda FrederickFACOG [facility], [facility] 11/08/2017  3:17 PM

## 2017-11-09 LAB — FETAL FIBRONECTIN: FETAL FIBRONECTIN: NEGATIVE

## 2017-11-11 NOTE — Progress Notes (Signed)
Let her know labs negative for preterm labor

## 2017-11-12 NOTE — Progress Notes (Signed)
Pt aware.

## 2017-11-19 ENCOUNTER — Encounter: Payer: Self-pay | Admitting: Maternal Newborn

## 2017-11-19 ENCOUNTER — Ambulatory Visit (INDEPENDENT_AMBULATORY_CARE_PROVIDER_SITE_OTHER): Payer: Medicaid Other | Admitting: Maternal Newborn

## 2017-11-19 ENCOUNTER — Other Ambulatory Visit: Payer: Self-pay | Admitting: Maternal Newborn

## 2017-11-19 ENCOUNTER — Other Ambulatory Visit: Payer: Medicaid Other

## 2017-11-19 DIAGNOSIS — Z3493 Encounter for supervision of normal pregnancy, unspecified, third trimester: Secondary | ICD-10-CM

## 2017-11-19 DIAGNOSIS — Z130 Encounter for screening for diseases of the blood and blood-forming organs and certain disorders involving the immune mechanism: Secondary | ICD-10-CM

## 2017-11-19 DIAGNOSIS — Z131 Encounter for screening for diabetes mellitus: Secondary | ICD-10-CM

## 2017-11-19 MED ORDER — CITRANATAL HARMONY 27-1-260 MG PO CAPS: 1.0000 | ORAL_CAPSULE | Freq: Every day | ORAL | 4 refills | Status: DC

## 2017-11-19 NOTE — Progress Notes (Signed)
Routine Prenatal Care Visit  Subjective  Colleen Peterson is a 27 y.o. G3P0020 at 3836w3d being seen today for ongoing prenatal care.  She is currently monitored for the following issues for this low-risk pregnancy and has Cystic acne vulgaris; Supervision of low-risk pregnancy; GBS bacteriuria; Migraine; and Heartburn during pregnancy in second trimester on their problem list.  ----------------------------------------------------------------------------------- Patient reports no complaints.   Contractions: Not present. Vag. Bleeding: None.  Movement: Present. Denies leaking of fluid.  ----------------------------------------------------------------------------------- The following portions of the patient's history were reviewed and updated as appropriate: allergies, current medications, past family history, past medical history, past social history, past surgical history and problem list. Problem list updated.   Objective  Blood pressure 110/70, weight 176 lb (79.8 kg), last menstrual period 05/04/2017, unknown if currently breastfeeding. Pregravid weight 160 lb (72.6 kg) Total Weight Gain 16 lb (7.258 kg) Urinalysis: Urine Protein: Negative Urine Glucose: Negative  Fetal Status: Fetal Heart Rate (bpm): 138   Movement: Present     General:  Alert, oriented and cooperative. Patient is in no acute distress.  Skin: Skin is warm and dry. No rash noted.   Cardiovascular: Normal heart rate noted  Respiratory: Normal respiratory effort, no problems with respiration noted  Abdomen: Soft, gravid, appropriate for gestational age. Pain/Pressure: Absent     Pelvic:  Cervical exam deferred        Extremities: Normal range of motion.  Edema: None  Mental Status: Normal mood and affect. Normal behavior. Normal judgment and thought content.     Assessment   27 y.o. G3P0020 at 5936w3d, EDD 02/08/2018 by Last Menstrual Period presenting for routine prenatal visit.  Plan   pregnancy Problems (from  06/04/17 to present)    Problem Noted Resolved   Migraine 08/02/2017 by Conard NovakJackson, Stephen D, MD No   Overview Signed 08/03/2017 12:52 PM by Conard NovakJackson, Stephen D, MD    Will treat initially with combination of compazine and benadryl.  Further treatment depending on response.       GBS bacteriuria 06/07/2017 by Conard NovakJackson, Stephen D, MD No   Overview Signed 06/07/2017  7:39 AM by Conard NovakJackson, Stephen D, MD    At [facility]. Will need GBS prophylaxis in labor      Supervision of low-risk pregnancy 06/04/2017 by Conard NovakJackson, Stephen D, MD No   Overview Addendum 09/17/2017 11:13 AM by Conard NovakJackson, Stephen D, MD    [facility]inic [facility] Prenatal Labs  Dating L=7 Blood type: O/Positive/-- (10/02 1512)   Genetic Screen 1 Screen: neg     AFP:  [ ]  offer 16-18 weeks    Antibody:Positive, See Final Results (10/02 1512)  Anatomic US Complete @ 19 wks Rubella: Immune  Varicella:  Immune  GTT Third trimester:  RPR: Non Reactive (10/02 1512)   Rhogam  HBsAg: Negative (10/02 1512)   TDaP vaccine                       Flu Shot: HIV:   Negative  Baby Food                                GBS:   Contraception  Pap:  CBB   SS screen: negative  CS/VBAC    Support Person          Acute cystitis during pregnancy in first trimester 06/07/2017 by Conard NovakJackson, Stephen D, MD 11/08/2017 by Nadara MustardHarris, Robert P, MD   Overview Signed  06/07/2017  7:40 AM by Conard Novak, MD    At [facility] with e. Coli and GBS - tx'd both with keflex       GTT/labs today.   Preterm labor symptoms and general obstetric precautions including but not limited to vaginal bleeding, contractions, leaking of fluid and fetal movement were reviewed in detail with the patient.  Return in about 2 weeks (around 12/03/2017) for ROB.  [name], CNM 11/19/2017  4:14 PM

## 2017-11-19 NOTE — Progress Notes (Signed)
No concerns.rj 

## 2017-11-20 ENCOUNTER — Encounter: Payer: Self-pay | Admitting: Obstetrics and Gynecology

## 2017-11-20 DIAGNOSIS — O99013 Anemia complicating pregnancy, third trimester: Secondary | ICD-10-CM | POA: Insufficient documentation

## 2017-11-20 LAB — 28 WEEK RH+PANEL
Basophils Absolute: 0 10*3/uL (ref 0.0–0.2)
Basos: 0 %
EOS (ABSOLUTE): 0.1 10*3/uL (ref 0.0–0.4)
EOS: 1 %
GESTATIONAL DIABETES SCREEN: 43 mg/dL — AB (ref 65–139)
HEMATOCRIT: 31.5 % — AB (ref 34.0–46.6)
HEMOGLOBIN: 10.8 g/dL — AB (ref 11.1–15.9)
HIV SCREEN 4TH GENERATION: NONREACTIVE
Immature Grans (Abs): 0.1 10*3/uL (ref 0.0–0.1)
Immature Granulocytes: 1 %
LYMPHS ABS: 3.5 10*3/uL — AB (ref 0.7–3.1)
Lymphs: 30 %
MCH: 31.6 pg (ref 26.6–33.0)
MCHC: 34.3 g/dL (ref 31.5–35.7)
MCV: 92 fL (ref 79–97)
Monocytes Absolute: 1 10*3/uL — ABNORMAL HIGH (ref 0.1–0.9)
Monocytes: 8 %
NEUTROS ABS: 7.1 10*3/uL — AB (ref 1.4–7.0)
Neutrophils: 60 %
PLATELETS: 303 10*3/uL (ref 150–379)
RBC: 3.42 x10E6/uL — ABNORMAL LOW (ref 3.77–5.28)
RDW: 13.2 % (ref 12.3–15.4)
RPR: NONREACTIVE
WBC: 11.7 10*3/uL — AB (ref 3.4–10.8)

## 2017-11-20 NOTE — Progress Notes (Signed)
Pls let pt know slightly anemic on labs and needs to add Fe supp to PNVs.

## 2017-12-03 ENCOUNTER — Ambulatory Visit (INDEPENDENT_AMBULATORY_CARE_PROVIDER_SITE_OTHER): Payer: Medicaid Other | Admitting: Certified Nurse Midwife

## 2017-12-03 VITALS — BP 102/62 | Wt 182.0 lb

## 2017-12-03 DIAGNOSIS — Z23 Encounter for immunization: Secondary | ICD-10-CM | POA: Diagnosis not present

## 2017-12-03 DIAGNOSIS — Z3A30 30 weeks gestation of pregnancy: Secondary | ICD-10-CM

## 2017-12-03 DIAGNOSIS — Z3493 Encounter for supervision of normal pregnancy, unspecified, third trimester: Secondary | ICD-10-CM

## 2017-12-03 DIAGNOSIS — R760 Raised antibody titer: Secondary | ICD-10-CM

## 2017-12-03 NOTE — Progress Notes (Signed)
Pt c/o feeling like she loses her balance frequently and lower back pain. Tdap and blood consent today.

## 2017-12-03 NOTE — Progress Notes (Signed)
ROB at [facility]30wk3day: 1 hour Gtt=43. Hemoglobin 10.8gm/dl Feeling a little lightheaded periodically. Discussed eating a hypoglycemic diet. Is now taking vitamins with iron regularly (had trouble getting vitamins previously)) Baby active. Wants to breast feed Given information on prenatal classes and CCBB. Received TDAP today and signed BC form. Repeat antibody screen (not done with 28 week labs)-for positive antibody screen at [facility]. RTO 2 weeks for ROB  Farrel Connersolleen Clayvon Parlett, CNM

## 2017-12-04 LAB — ANTIBODY SCREEN: ANTIBODY SCREEN: NEGATIVE

## 2017-12-17 ENCOUNTER — Ambulatory Visit (INDEPENDENT_AMBULATORY_CARE_PROVIDER_SITE_OTHER): Payer: Medicaid Other | Admitting: Maternal Newborn

## 2017-12-17 ENCOUNTER — Encounter: Payer: Self-pay | Admitting: Maternal Newborn

## 2017-12-17 VITALS — BP 120/80 | Wt 181.0 lb

## 2017-12-17 DIAGNOSIS — Z3A32 32 weeks gestation of pregnancy: Secondary | ICD-10-CM

## 2017-12-17 DIAGNOSIS — Z3493 Encounter for supervision of normal pregnancy, unspecified, third trimester: Secondary | ICD-10-CM

## 2017-12-17 NOTE — Progress Notes (Signed)
No concerns.rj 

## 2017-12-17 NOTE — Progress Notes (Signed)
Routine Prenatal Care Visit  Subjective  Colleen Peterson is a 27 y.o. G3P0020 at [redacted]w[redacted]d being seen today for ongoing prenatal care.  She is currently monitored for the following issues for this low-risk pregnancy and has Cystic acne vulgaris; Supervision of low-risk pregnancy; GBS bacteriuria; Migraine; Heartburn during pregnancy in second trimester; and Anemia of pregnancy in third trimester on their problem list.  ----------------------------------------------------------------------------------- Patient reports some swelling in vulvar area with pressure from baby's position. Contractions: Not present. Vag. Bleeding: None.  Movement: Present. Denies leaking of fluid.  ----------------------------------------------------------------------------------- The following portions of the patient's history were reviewed and updated as appropriate: allergies, current medications, past family history, past medical history, past social history, past surgical history and problem list. Problem list updated.   Objective  Blood pressure 120/80, weight 181 lb (82.1 kg), last menstrual period 05/04/2017. Pregravid weight 160 lb (72.6 kg) Total Weight Gain 21 lb (9.526 kg) Urinalysis: Urine Protein: Trace Urine Glucose: Negative  Fetal Status: Fetal Heart Rate (bpm): 140 Fundal Height: 32 cm Movement: Present     General:  Alert, oriented and cooperative. Patient is in no acute distress.  Skin: Skin is warm and dry. No rash noted.   Cardiovascular: Normal heart rate noted  Respiratory: Normal respiratory effort, no problems with respiration noted  Abdomen: Soft, gravid, appropriate for gestational age. Pain/Pressure: Absent     Pelvic:  Cervical exam deferred        Extremities: Normal range of motion.  Edema: None  Mental Status: Normal mood and affect. Normal behavior. Normal judgment and thought content.     Assessment   27 y.o. G3P0020 at [redacted]w[redacted]d, EDD 02/08/2018 by Last Menstrual Period  presenting for routine prenatal visit.  Plan   pregnancy Problems (from 06/04/17 to present)    Problem Noted Resolved   Migraine 08/02/2017 by Conard Novak, MD No   Overview Signed 08/03/2017 12:52 PM by Conard Novak, MD    Will treat initially with combination of compazine and benadryl.  Further treatment depending on response.       GBS bacteriuria 06/07/2017 by Conard Novak, MD No   Overview Signed 06/07/2017  7:39 AM by Conard Novak, MD    At [facility]. Will need GBS prophylaxis in labor      Supervision of low-risk pregnancy 06/04/2017 by Conard Novak, MD No   Overview Addendum 12/03/2017  2:58 PM by Farrel Conners, CNM    [facility] Prenatal Labs  Dating L=7 Blood type: O/Positive/-- (10/02 1512)   Genetic Screen 1 Screen: neg     AFP:  [ ]  offer 16-18 weeks    Antibody:Positive, See Final Results (10/02 1512)  Anatomic [place] Complete @ 19 wks Rubella: Immune  Varicella:  Immune  GTT Third trimester: 43 RPR: Non Reactive (10/02 1512)   Rhogam  HBsAg: Negative (10/02 1512)   TDaP vaccine        12/03/17               Flu Shot: HIV:   Negative  Baby Food       Breast                         GBS:   Contraception  Pap:  CBB   SS screen: negative  CS/VBAC    Support Person          Acute cystitis during pregnancy in first trimester 06/07/2017 by Conard Novak, MD  11/08/2017 by Nadara MustardHarris, Robert P, MD   Overview Signed 06/07/2017  7:40 AM by Conard NovakJackson, Stephen D, MD    [facility] - tx'd both with keflex         Preterm labor symptoms and general obstetric precautions including but not limited to vaginal bleeding, contractions, leaking of fluid and fetal movement were reviewed.  Return in about 2 weeks (around 12/31/2017) for ROB.  Marcelyn BruinsJacelyn Keylani Perlstein, CNM 12/17/2017  1:56 PM

## 2017-12-31 ENCOUNTER — Ambulatory Visit (INDEPENDENT_AMBULATORY_CARE_PROVIDER_SITE_OTHER): Payer: Medicaid Other | Admitting: Maternal Newborn

## 2017-12-31 VITALS — BP 120/80 | Wt 184.0 lb

## 2017-12-31 DIAGNOSIS — Z3493 Encounter for supervision of normal pregnancy, unspecified, third trimester: Secondary | ICD-10-CM

## 2017-12-31 DIAGNOSIS — Z3A36 36 weeks gestation of pregnancy: Secondary | ICD-10-CM

## 2017-12-31 NOTE — Progress Notes (Signed)
No concerns.rj 

## 2018-01-01 ENCOUNTER — Encounter: Payer: Self-pay | Admitting: Maternal Newborn

## 2018-01-01 NOTE — Progress Notes (Signed)
Routine Prenatal Care Visit  Subjective  Colleen Peterson is a 27 y.o. G3P0020 at [redacted]w[redacted]d being seen today for ongoing prenatal care.  She is currently monitored for the following issues for this low-risk pregnancy and has Cystic acne vulgaris; Supervision of low-risk pregnancy; GBS bacteriuria; Migraine; Heartburn during pregnancy in second trimester; and Anemia of pregnancy in third trimester on their problem list.  ----------------------------------------------------------------------------------- Patient reports no complaints.   Contractions: Not present. Vag. Bleeding: None.  Movement: Present. Denies leaking of fluid.  ----------------------------------------------------------------------------------- The following portions of the patient's history were reviewed and updated as appropriate: allergies, current medications, past family history, past medical history, past social history, past surgical history and problem list. Problem list updated.   Objective  Blood pressure 120/80, weight 184 lb (83.5 kg), last menstrual period 05/04/2017. Pregravid weight 160 lb (72.6 kg) Total Weight Gain 24 lb (10.9 kg) Urinalysis: Urine Protein: Negative Urine Glucose: Negative  Fetal Status: Fetal Heart Rate (bpm): 150 Fundal Height: 36 cm Movement: Present     General:  Alert, oriented and cooperative. Patient is in no acute distress.  Skin: Skin is warm and dry. No rash noted.   Cardiovascular: Normal heart rate noted  Respiratory: Normal respiratory effort, no problems with respiration noted  Abdomen: Soft, gravid, appropriate for gestational age. Pain/Pressure: Absent     Pelvic:  Cervical exam deferred        Extremities: Normal range of motion.     Mental Status: Normal mood and affect. Normal behavior. Normal judgment and thought content.     Assessment   27 y.o. G3P0020 at [redacted]w[redacted]d, EDD 02/08/2018 by Last Menstrual Period presenting for routine prenatal visit.  Plan   pregnancy  Problems (from 06/04/17 to present)    Problem Noted Resolved   Migraine 08/02/2017 by Conard Novak, MD No   Overview Signed 08/03/2017 12:52 PM by Conard Novak, MD    Will treat initially with combination of compazine and benadryl.  Further treatment depending on response.       GBS bacteriuria 06/07/2017 by Conard Novak, MD No   Overview Signed 06/07/2017  7:39 AM by Conard Novak, MD    At [facility]. Will need GBS prophylaxis in labor      Supervision of low-risk pregnancy 06/04/2017 by Conard Novak, MD No   Overview Addendum 12/03/2017  2:58 PM by Farrel Conners, CNM    [facility] Prenatal Labs  Dating L=7 Blood type: O/Positive/-- (10/02 1512)   Genetic Screen 1 Screen: neg     AFP:   offer 16-18 weeks    Antibody:Positive, See Final Results (10/02 1512)  Anatomic [place] Complete @ 19 wks Rubella: Immune  Varicella:  Immune  GTT Third trimester: 43 RPR: Non Reactive (10/02 1512)   Rhogam  HBsAg: Negative (10/02 1512)   TDaP vaccine        12/03/17               Flu Shot: HIV:   Negative  Baby Food       Breast                         GBS:   Contraception  Pap:  CBB   SS screen: negative  CS/VBAC    Support Person          Acute cystitis during pregnancy in first trimester 06/07/2017 by Conard Novak, MD 11/08/2017 by Nadara Mustard, MD  Overview Signed 06/07/2017  7:40 AM by Conard Novak, MD    At [facility] with e. Coli and GBS - tx'd both with keflex         Gestational age appropriate obstetric precautions reviewed.  Return in about 2 weeks (around 01/14/2018) for ROB.  [name], CNM 01/01/2018  4:50 PM

## 2018-01-14 ENCOUNTER — Encounter: Payer: Self-pay | Admitting: Maternal Newborn

## 2018-01-14 ENCOUNTER — Ambulatory Visit (INDEPENDENT_AMBULATORY_CARE_PROVIDER_SITE_OTHER): Payer: Medicaid Other | Admitting: Maternal Newborn

## 2018-01-14 VITALS — BP 100/60 | Wt 185.0 lb

## 2018-01-14 DIAGNOSIS — Z3A36 36 weeks gestation of pregnancy: Secondary | ICD-10-CM

## 2018-01-14 DIAGNOSIS — R12 Heartburn: Secondary | ICD-10-CM

## 2018-01-14 DIAGNOSIS — Z3493 Encounter for supervision of normal pregnancy, unspecified, third trimester: Secondary | ICD-10-CM

## 2018-01-14 DIAGNOSIS — O26893 Other specified pregnancy related conditions, third trimester: Secondary | ICD-10-CM

## 2018-01-14 MED ORDER — RANITIDINE HCL 75 MG PO TABS: 75.0000 mg | ORAL_TABLET | Freq: Two times a day (BID) | ORAL | 3 refills | Status: DC

## 2018-01-14 NOTE — Progress Notes (Signed)
Routine Prenatal Care Visit  Subjective  Colleen Peterson is a 27 y.o. G3P0020 at [redacted]w[redacted]d being seen today for ongoing prenatal care.  She is currently monitored for the following issues for this low-risk pregnancy and has Cystic acne vulgaris; Supervision of low-risk pregnancy; GBS bacteriuria; Migraine; Heartburn during pregnancy in second trimester; and Anemia of pregnancy in third trimester on their problem list.  ----------------------------------------------------------------------------------- Patient reports heartburn and increase in vaginal discharge. Her discharge is clear and thin without an odor. No itching or vaginal irritation. Contractions: Not present. Vag. Bleeding: None.  Movement: Present. Denies leaking of fluid.  ----------------------------------------------------------------------------------- The following portions of the patient's history were reviewed and updated as appropriate: allergies, current medications, past family history, past medical history, past social history, past surgical history and problem list. Problem list updated.   Objective  Blood pressure 100/60, weight 185 lb (83.9 kg), last menstrual period 05/04/2017, unknown if currently breastfeeding. Pregravid weight 160 lb (72.6 kg) Total Weight Gain 25 lb (11.3 kg) Urinalysis: Urine Protein: Negative Urine Glucose: Negative  Fetal Status: Fetal Heart Rate (bpm): 142 Fundal Height: 34 cm Movement: Present     General:  Alert, oriented and cooperative. Patient is in no acute distress.  Skin: Skin is warm and dry. No rash noted.   Cardiovascular: Normal heart rate noted  Respiratory: Normal respiratory effort, no problems with respiration noted  Abdomen: Soft, gravid, appropriate for gestational age. Pain/Pressure: Absent     Pelvic:  Cervical exam deferred        Extremities: Normal range of motion.     Mental Status: Normal mood and affect. Normal behavior. Normal judgment and thought content.    Sample of discharge was nitrazine negative.   Assessment   27 y.o. G3P0020 at [redacted]w[redacted]d, EDD 02/08/2018 by Last Menstrual Period presenting for routine prenatal visit.  Plan   pregnancy Problems (from 06/04/17 to present)    Problem Noted Resolved   Migraine 08/02/2017 by Conard Novak, MD No   Overview Signed 08/03/2017 12:52 PM by Conard Novak, MD    Will treat initially with combination of compazine and benadryl.  Further treatment depending on response.       GBS bacteriuria 06/07/2017 by Conard Novak, MD No   Overview Signed 06/07/2017  7:39 AM by Conard Novak, MD    At [facility] Will need GBS prophylaxis in labor      Supervision of low-risk pregnancy 06/04/2017 by Conard Novak, MD No   Overview Addendum 12/03/2017  2:58 PM by Farrel Conners, CNM    [facility] Prenatal Labs  Dating L=7 Blood type: O/Positive/-- (10/02 1512)   Genetic Screen 1 Screen: neg     AFP:   offer 16-18 weeks    Antibody:Positive, See Final Results (10/02 1512)  Anatomic [place] Complete @ 19 wks Rubella: Immune  Varicella:  Immune  GTT Third trimester: 43 RPR: Non Reactive (10/02 1512)   Rhogam  HBsAg: Negative (10/02 1512)   TDaP vaccine        12/03/17               Flu Shot: HIV:   Negative  Baby Food       Breast                         GBS:   Contraception  Pap:  CBB   SS screen: negative  CS/VBAC    Support Person  Acute cystitis during pregnancy in first trimester 06/07/2017 by Conard Novak, MD 11/08/2017 by Nadara Mustard, MD   Overview Signed 06/07/2017  7:40 AM by Conard Novak, MD    At [facility] with e. Coli and GBS - tx'd both with keflex       GBS and GC today. Discussed characteristics of physiological discharge in pregnancy vs. other causes, appears normal at this time. Advised taking Zantac for heartburn and acid reflux symptoms.  Gestational age appropriate obstetric precautions were reviewed.  Return in about 1 week (around 01/21/2018)  for ROB.  [name], CNM 01/14/2018  2:01 PM

## 2018-01-14 NOTE — Progress Notes (Signed)
C/o a lot of leakage, started maybe last week, it's not pee, can't close legs really.rj

## 2018-01-16 LAB — STREP GP B NAA: STREP GROUP B AG: POSITIVE — AB

## 2018-01-16 LAB — GC/CHLAMYDIA PROBE AMP
CHLAMYDIA, DNA PROBE: NEGATIVE
Neisseria gonorrhoeae by PCR: NEGATIVE

## 2018-01-22 ENCOUNTER — Ambulatory Visit (INDEPENDENT_AMBULATORY_CARE_PROVIDER_SITE_OTHER): Payer: Medicaid Other | Admitting: Maternal Newborn

## 2018-01-22 ENCOUNTER — Encounter: Payer: Self-pay | Admitting: Maternal Newborn

## 2018-01-22 VITALS — BP 102/62 | Wt 187.0 lb

## 2018-01-22 DIAGNOSIS — Z3493 Encounter for supervision of normal pregnancy, unspecified, third trimester: Secondary | ICD-10-CM

## 2018-01-22 DIAGNOSIS — Z3A37 37 weeks gestation of pregnancy: Secondary | ICD-10-CM

## 2018-01-22 NOTE — Progress Notes (Signed)
Pt c/o pain in upper abdomen and lower back. Desires cervical check.

## 2018-01-22 NOTE — Patient Instructions (Signed)
Vaginal Delivery Vaginal delivery means that you will give birth by pushing your baby out of your birth canal (vagina). A team of health care providers will help you before, during, and after vaginal delivery. Birth experiences are unique for every woman and every pregnancy, and birth experiences vary depending on where you choose to give birth. What should I do to prepare for my baby's birth? Before your baby is born, it is important to talk with your health care provider about:  Your labor and delivery preferences. These may include: ? Medicines that you may be given. ? How you will manage your pain. This might include non-medical pain relief techniques or injectable pain relief such as epidural analgesia. ? How you and your baby will be monitored during labor and delivery. ? Who may be in the labor and delivery room with you. ? Your feelings about surgical delivery of your baby (cesarean delivery, or C-section) if this becomes necessary. ? Your feelings about receiving donated blood through an IV tube (blood transfusion) if this becomes necessary.  Whether you are able: ? To take pictures or videos of the birth. ? To eat during labor and delivery. ? To move around, walk, or change positions during labor and delivery.  What to expect after your baby is born, such as: ? Whether delayed umbilical cord clamping and cutting is offered. ? Who will care for your baby right after birth. ? Medicines or tests that may be recommended for your baby. ? Whether breastfeeding is supported in your hospital or birth center. ? How long you will be in the hospital or birth center.  How any medical conditions you have may affect your baby or your labor and delivery experience.  To prepare for your baby's birth, you should also:  Attend all of your health care visits before delivery (prenatal visits) as recommended by your health care provider. This is important.  Prepare your home for your baby's  arrival. Make sure that you have: ? Diapers. ? Baby clothing. ? Feeding equipment. ? Safe sleeping arrangements for you and your baby.  Install a car seat in your vehicle. Have your car seat checked by a certified car seat installer to make sure that it is installed safely.  Think about who will help you with your new baby at home for at least the first several weeks after delivery.  What can I expect when I arrive at the birth center or hospital? Once you are in labor and have been admitted into the hospital or birth center, your health care provider may:  Review your pregnancy history and any concerns you have.  Insert an IV tube into one of your veins. This is used to give you fluids and medicines.  Check your blood pressure, pulse, temperature, and heart rate (vital signs).  Check whether your bag of water (amniotic sac) has broken (ruptured).  Talk with you about your birth plan and discuss pain control options.  Monitoring Your health care provider may monitor your contractions (uterine monitoring) and your baby's heart rate (fetal monitoring). You may need to be monitored:  Often, but not continuously (intermittently).  All the time or for long periods at a time (continuously). Continuous monitoring may be needed if: ? You are taking certain medicines, such as medicine to relieve pain or make your contractions stronger. ? You have pregnancy or labor complications.  Monitoring may be done by:  Placing a special stethoscope or a handheld monitoring device on your abdomen to   check your baby's heartbeat, and feeling your abdomen for contractions. This method of monitoring does not continuously record your baby's heartbeat or your contractions.  Placing monitors on your abdomen (external monitors) to record your baby's heartbeat and the frequency and length of contractions. You may not have to wear external monitors all the time.  Placing monitors inside of your uterus  (internal monitors) to record your baby's heartbeat and the frequency, length, and strength of your contractions. ? Your health care provider may use internal monitors if he or she needs more information about the strength of your contractions or your baby's heart rate. ? Internal monitors are put in place by passing a thin, flexible wire through your vagina and into your uterus. Depending on the type of monitor, it may remain in your uterus or on your baby's head until birth. ? Your health care provider will discuss the benefits and risks of internal monitoring with you and will ask for your permission before inserting the monitors.  Telemetry. This is a type of continuous monitoring that can be done with external or internal monitors. Instead of having to stay in bed, you are able to move around during telemetry. Ask your health care provider if telemetry is an option for you.  Physical exam Your health care provider may perform a physical exam. This may include:  Checking whether your baby is positioned: ? With the head toward your vagina (head-down). This is most common. ? With the head toward the top of your uterus (head-up or breech). If your baby is in a breech position, your health care provider may try to turn your baby to a head-down position so you can deliver vaginally. If it does not seem that your baby can be born vaginally, your provider may recommend surgery to deliver your baby. In rare cases, you may be able to deliver vaginally if your baby is head-up (breech delivery). ? Lying sideways (transverse). Babies that are lying sideways cannot be delivered vaginally.  Checking your cervix to determine: ? Whether it is thinning out (effacing). ? Whether it is opening up (dilating). ? How low your baby has moved into your birth canal.  What are the three stages of labor and delivery?  Normal labor and delivery is divided into the following three stages: Stage 1  Stage 1 is the  longest stage of labor, and it can last for hours or days. Stage 1 includes: ? Early labor. This is when contractions may be irregular, or regular and mild. Generally, early labor contractions are more than 10 minutes apart. ? Active labor. This is when contractions get longer, more regular, more frequent, and more intense. ? The transition phase. This is when contractions happen very close together, are very intense, and may last longer than during any other part of labor.  Contractions generally feel mild, infrequent, and irregular at first. They get stronger, more frequent (about every 2-3 minutes), and more regular as you progress from early labor through active labor and transition.  Many women progress through stage 1 naturally, but you may need help to continue making progress. If this happens, your health care provider may talk with you about: ? Rupturing your amniotic sac if it has not ruptured yet. ? Giving you medicine to help make your contractions stronger and more frequent.  Stage 1 ends when your cervix is completely dilated to 4 inches (10 cm) and completely effaced. This happens at the end of the transition phase. Stage 2  Once   your cervix is completely effaced and dilated to 4 inches (10 cm), you may start to feel an urge to push. It is common for the body to naturally take a rest before feeling the urge to push, especially if you received an epidural or certain other pain medicines. This rest period may last for up to 1-2 hours, depending on your unique labor experience.  During stage 2, contractions are generally less painful, because pushing helps relieve contraction pain. Instead of contraction pain, you may feel stretching and burning pain, especially when the widest part of your baby's head passes through the vaginal opening (crowning).  Your health care provider will closely monitor your pushing progress and your baby's progress through the vagina during stage 2.  Your  health care provider may massage the area of skin between your vaginal opening and anus (perineum) or apply warm compresses to your perineum. This helps it stretch as the baby's head starts to crown, which can help prevent perineal tearing. ? In some cases, an incision may be made in your perineum (episiotomy) to allow the baby to pass through the vaginal opening. An episiotomy helps to make the opening of the vagina larger to allow more room for the baby to fit through.  It is very important to breathe and focus so your health care provider can control the delivery of your baby's head. Your health care provider may have you decrease the intensity of your pushing, to help prevent perineal tearing.  After delivery of your baby's head, the shoulders and the rest of the body generally deliver very quickly and without difficulty.  Once your baby is delivered, the umbilical cord may be cut right away, or this may be delayed for 1-2 minutes, depending on your baby's health. This may vary among health care providers, hospitals, and birth centers.  If you and your baby are healthy enough, your baby may be placed on your chest or abdomen to help maintain the baby's temperature and to help you bond with each other. Some mothers and babies start breastfeeding at this time. Your health care team will dry your baby and help keep your baby warm during this time.  Your baby may need immediate care if he or she: ? Showed signs of distress during labor. ? Has a medical condition. ? Was born too early (prematurely). ? Had a bowel movement before birth (meconium). ? Shows signs of difficulty transitioning from being inside the uterus to being outside of the uterus. If you are planning to breastfeed, your health care team will help you begin a feeding. Stage 3  The third stage of labor starts immediately after the birth of your baby and ends after you deliver the placenta. The placenta is an organ that develops  during pregnancy to provide oxygen and nutrients to your baby in the womb.  Delivering the placenta may require some pushing, and you may have mild contractions. Breastfeeding can stimulate contractions to help you deliver the placenta.  After the placenta is delivered, your uterus should tighten (contract) and become firm. This helps to stop bleeding in your uterus. To help your uterus contract and to control bleeding, your health care provider may: ? Give you medicine by injection, through an IV tube, by mouth, or through your rectum (rectally). ? Massage your abdomen or perform a vaginal exam to remove any blood clots that are left in your uterus. ? Empty your bladder by placing a thin, flexible tube (catheter) into your bladder. ? Encourage   you to breastfeed your baby. After labor is over, you and your baby will be monitored closely to ensure that you are both healthy until you are ready to go home. Your health care team will teach you how to care for yourself and your baby. This information is not intended to replace advice given to you by your health care provider. Make sure you discuss any questions you have with your health care provider. Document Released: 05/29/2008 Document Revised: 03/09/2016 Document Reviewed: 09/04/2015 [employer] Interactive Patient Education  2018 [employer]  

## 2018-01-22 NOTE — Progress Notes (Signed)
Routine Prenatal Care Visit  Subjective  Colleen Peterson is a 27 y.o. G3P0020 at [redacted]w[redacted]d being seen today for ongoing prenatal care.  She is currently monitored for the following issues for this low-risk pregnancy and has Cystic acne vulgaris; Supervision of low-risk pregnancy; GBS bacteriuria; Migraine; Heartburn during pregnancy in third trimester; and Anemia of pregnancy in third trimester on their problem list.  ----------------------------------------------------------------------------------- Patient reports backache and occasional contractions.  Also some pain in upper abdomen ribs where baby is kicking/moving. Contractions: Irregular. Vag. Bleeding: None.  Movement: Present. No leaking of fluid.  ----------------------------------------------------------------------------------- The following portions of the patient's history were reviewed and updated as appropriate: allergies, current medications, past family history, past medical history, past social history, past surgical history and problem list. Problem list updated.   Objective  Blood pressure 102/62, weight 187 lb (84.8 kg), last menstrual period 05/04/2017. Pregravid weight 160 lb (72.6 kg) Total Weight Gain 27 lb (12.2 kg) Urinalysis: Urine Protein: Negative Urine Glucose: Negative  Fetal Status: Fetal Heart Rate (bpm): 145 Fundal Height: 36 cm Movement: Present     General:  Alert, oriented and cooperative. Patient is in no acute distress.  Skin: Skin is warm and dry. No rash noted.   Cardiovascular: Normal heart rate noted  Respiratory: Normal respiratory effort, no problems with respiration noted  Abdomen: Soft, gravid, appropriate for gestational age. Pain/Pressure: Absent     Pelvic:  Cervical exam performed        Extremities: Normal range of motion.     Mental Status: Normal mood and affect. Normal behavior. Normal judgment and thought content.     Assessment   27 y.o. G3P0020 at [redacted]w[redacted]d, EDD 02/08/2018 by  Last Menstrual Period presenting for routine prenatal visit.  Plan   pregnancy Problems (from 06/04/17 to present)    Problem Noted Resolved   Migraine 08/02/2017 by Conard Novak, MD No   Overview Signed 08/03/2017 12:52 PM by Conard Novak, MD    Will treat initially with combination of compazine and benadryl.  Further treatment depending on response.       GBS bacteriuria 06/07/2017 by Conard Novak, MD No   Overview Signed 06/07/2017  7:39 AM by Conard Novak, MD    At [facility]. Will need GBS prophylaxis in labor      Supervision of low-risk pregnancy 06/04/2017 by Conard Novak, MD No   Overview Addendum 12/03/2017  2:58 PM by Farrel Conners, CNM    [facility]inic [facility] Prenatal Labs  Dating L=7 Blood type: O/Positive/-- (10/02 1512)   Genetic Screen 1 Screen: neg     AFP:   offer 16-18 weeks    Antibody:Positive, See Final Results (10/02 1512)  Anatomic [place] Complete @ 19 wks Rubella: Immune  Varicella:  Immune  GTT Third trimester: 43 RPR: Non Reactive (10/02 1512)   Rhogam  HBsAg: Negative (10/02 1512)   TDaP vaccine        12/03/17               Flu Shot: HIV:   Negative  Baby Food       Breast                         GBS:   Contraception  Pap:  CBB   SS screen: negative  CS/VBAC    Support Person          Acute cystitis during pregnancy in first trimester 06/07/2017  by Conard Novak, MD 11/08/2017 by Nadara Mustard, MD   Overview Signed 06/07/2017  7:40 AM by Conard Novak, MD    At [facility] with e. Coli and GBS - tx'd both with keflex       Advised pregnancy support belt to help with backache.  Discussed normal labor and management of postdates. She desires 41 week induction in the absence of labor.  Term labor symptoms and general obstetric precautions were reviewed.  Please refer to After Visit Summary for other counseling recommendations.   Return in about 1 week (around 01/29/2018) for ROB.  [name], CNM 01/22/2018  2:34 PM

## 2018-01-29 ENCOUNTER — Encounter: Payer: Medicaid Other | Admitting: Maternal Newborn

## 2018-01-31 ENCOUNTER — Ambulatory Visit (INDEPENDENT_AMBULATORY_CARE_PROVIDER_SITE_OTHER): Payer: Medicaid Other | Admitting: Certified Nurse Midwife

## 2018-01-31 VITALS — BP 108/68 | Wt 189.0 lb

## 2018-01-31 DIAGNOSIS — Z3A38 38 weeks gestation of pregnancy: Secondary | ICD-10-CM

## 2018-01-31 DIAGNOSIS — Z3493 Encounter for supervision of normal pregnancy, unspecified, third trimester: Secondary | ICD-10-CM

## 2018-01-31 NOTE — Progress Notes (Signed)
Pt c/o cramps, back pain, leg pain and pelvic pressure.

## 2018-02-01 NOTE — Progress Notes (Signed)
ROB visit at 38wk6days: Becoming increasingly uncomfortable with contractions, pelvic pressure. Wants to start maternity leave today and given note for work. Baby active. No vaginal bleeding or leakage of fluid Cervix: closed/ 50%/ -2/ medium/posterior Breast feeding Discussed birth control options pp and given booklet Labor precautions ROB in 1 week Farrel Connersolleen Rice Walsh, CNM

## 2018-02-02 ENCOUNTER — Encounter: Payer: Self-pay | Admitting: *Deleted

## 2018-02-02 ENCOUNTER — Observation Stay
Admission: EM | Admit: 2018-02-02 | Discharge: 2018-02-02 | Disposition: A | Discharge status: Home / Self Care | Payer: Medicaid Other | Attending: Certified Nurse Midwife | Admitting: Certified Nurse Midwife

## 2018-02-02 ENCOUNTER — Other Ambulatory Visit: Payer: Self-pay

## 2018-02-02 DIAGNOSIS — Z3493 Encounter for supervision of normal pregnancy, unspecified, third trimester: Secondary | ICD-10-CM

## 2018-02-02 DIAGNOSIS — G43009 Migraine without aura, not intractable, without status migrainosus: Secondary | ICD-10-CM

## 2018-02-02 DIAGNOSIS — Z3A39 39 weeks gestation of pregnancy: Secondary | ICD-10-CM | POA: Insufficient documentation

## 2018-02-02 DIAGNOSIS — O9989 Other specified diseases and conditions complicating pregnancy, childbirth and the puerperium: Secondary | ICD-10-CM | POA: Diagnosis not present

## 2018-02-02 DIAGNOSIS — R8271 Bacteriuria: Secondary | ICD-10-CM

## 2018-02-02 DIAGNOSIS — O4290 Premature rupture of membranes, unspecified as to length of time between rupture and onset of labor, unspecified weeks of gestation: Secondary | ICD-10-CM

## 2018-02-02 NOTE — Final Progress Note (Signed)
Physician Final Progress Note  Patient ID: Colleen Peterson MRN: [id number] DOB/AGE: [birth year] 27 y.o.  Admit date: 02/02/2018 Admitting provider: Nadara Mustardobert P Harris, MD Discharge date: 02/02/2018   Admission Diagnoses: Leakage of fluid IUP at 39wk1d  Discharge Diagnoses: IUP at 39 wk1d, no evidence of spontaneous rupture of membranes   Consults: None  Significant Findings/ Diagnostic Studies: 27 yo G3 P0020 with EDC=02/08/2018 by LMP 05/04/2017 presents at 39wk1d with complaints of having a trickle of fluid from the vaginal about 3 hours PTA. No leakage of fluid since then. Irregular contractions present. No bleeding. Baby very active (mother drinking sweet tea on arrival).  [facility] remarkable for GBS bacteruria and mild anemia.   [facility]inic [facility] Prenatal Labs  Dating L=7 Blood type: O/Positive/-- (10/02 1512)   Genetic Screen 1 Screen: neg     AFP:  [ ]  offer 16-18 weeks    Antibody:Positive, See Final Results (10/02 1512); repeat 4/2 was negative  Anatomic US Complete @ 19 wks Rubella: Immune  Varicella:  Immune  GTT Third trimester: 43 RPR: Non Reactive (10/02 1512)   Rhogam  HBsAg: Negative (10/02 1512)   TDaP vaccine        12/03/17               Flu Shot: HIV:   Negative  Baby Food       Breast                         GBS: positive 01/04/2018  Contraception  Pap:  CBB   SS screen: negative  CS/VBAC    Support Person     Past Medical History:  Diagnosis Date  . BV (bacterial vaginosis)   . Trichomoniasis 05/2017   Past Surgical History:  Procedure Laterality Date  . NO PAST SURGERIES     Family History  Problem Relation Age of Onset  . Breast cancer Maternal Grandmother    Social History   Socioeconomic History  . Marital status: Single    Spouse name: Not on file  . Number of children: 0  . Years of education: Not on file  . Highest education level: Not on file  Occupational History  . Not on file  Social Needs  . Financial resource strain: Not on  file  . Food insecurity:    Worry: Not on file    Inability: Not on file  . Transportation needs:    Medical: Not on file    Non-medical: Not on file  Tobacco Use  . Smoking status: Never Smoker  . Smokeless tobacco: Never Used  Substance and Sexual Activity  . Alcohol use: Yes    Comment: Socially   . Drug use: No  . Sexual activity: Yes    Birth control/protection: None  Lifestyle  . Physical activity:    Days per week: Not on file    Minutes per session: Not on file  . Stress: Not on file  Relationships  . Social connections:    Talks on phone: Not on file    Gets together: Not on file    Attends religious service: Not on file    Active member of club or organization: Not on file    Attends meetings of clubs or organizations: Not on file    Relationship status: Not on file  . Intimate partner violence:    Fear of current or ex partner: Not on file    Emotionally abused: Not  on file    Physically abused: Not on file    Forced sexual activity: Not on file  Other Topics Concern  . Not on file  Social History Narrative  . Not on file  Exam: General: comfortable gravid, BF in NAD BP 119/79 (BP Location: Right Arm)   Pulse 97   Temp 98.3 F (36.8 C) (Oral)   Resp 16   Ht 5\' 4"  (1.626 m)   Wt 85.7 kg (189 lb)   LMP 05/04/2017   BMI 32.44 kg/m   Abdomen: gravid, NT FHR: 130s baseline with prolonged accelerations to 170s to 180s, moderate variability Toco: irritability and occasional contraction Pelvic exam: External/BUS: no lesions or inflammation Vagina: white discharge, Nitrazine negative Cervix; closed/ 50-60%/-2 and posterior  A: No evidence of rupture Cat 1 tracing  P: DC home with labor precautions ROB on 7 June if NIL  Procedures: none  Discharge Condition: stable  Disposition: Discharge disposition: 01-Home or Self Care       Diet: Regular diet  Discharge Activity: Activity as tolerated  Discharge Instructions    Discharge patient    Complete by:  As directed    Discharge disposition:  01-Home or Self Care   Discharge patient date:  02/02/2018     Allergies as of 02/02/2018   No Known Allergies     Medication List    TAKE these medications   CITRANATAL HARMONY 27-1-260 MG Caps Take 1 capsule by mouth daily.   ranitidine 75 MG tablet Commonly known as:  ZANTAC 75 Take 1 tablet (75 mg total) by mouth 2 (two) times daily.        Total time spent taking care of this patient: 15 minutes  Signed: Farrel Conners 02/02/2018, 8:18 PM

## 2018-02-02 NOTE — Discharge Instructions (Signed)
Return to hospital for increase in painful contractions, leaking of fluid, vaginal bleeding, or decreased fetal movement. °

## 2018-02-02 NOTE — OB Triage Note (Signed)
Patient given discharge instructions including follow up information, labor precautions, and fetal kick count instructions. Verbalized understanding. All questions answered at this time. Discharged in stable condition, ambulatory, accompanied by family member.

## 2018-02-02 NOTE — OB Triage Note (Signed)
Thinks water broke @ 1555 today. Trickle of fluid noted when she stood up after lying down. Denies ant odor, color. Fetal movement noted by RN. Denies any vaginal bleeding. Colleen Peterson, [name]

## 2018-02-07 ENCOUNTER — Ambulatory Visit (INDEPENDENT_AMBULATORY_CARE_PROVIDER_SITE_OTHER): Payer: Medicaid Other | Admitting: Maternal Newborn

## 2018-02-07 ENCOUNTER — Encounter: Payer: Self-pay | Admitting: Maternal Newborn

## 2018-02-07 VITALS — BP 110/80 | Wt 191.0 lb

## 2018-02-07 DIAGNOSIS — Z3A39 39 weeks gestation of pregnancy: Secondary | ICD-10-CM

## 2018-02-07 DIAGNOSIS — Z369 Encounter for antenatal screening, unspecified: Secondary | ICD-10-CM

## 2018-02-07 DIAGNOSIS — Z3493 Encounter for supervision of normal pregnancy, unspecified, third trimester: Secondary | ICD-10-CM

## 2018-02-07 NOTE — Progress Notes (Signed)
C/o ctxs 10-7415min apart, wants cx ck'd.rj

## 2018-02-07 NOTE — Progress Notes (Signed)
Routine Prenatal Care Visit  Subjective  Colleen Peterson is a 27 y.o. G3P0020 at [redacted]w[redacted]d being seen today for ongoing prenatal care.  She is currently monitored for the following issues for this low-risk pregnancy and has Cystic acne vulgaris; Supervision of low-risk pregnancy; GBS bacteriuria; Migraine; Heartburn during pregnancy in third trimester; and Anemia of pregnancy in third trimester on their problem list.  ----------------------------------------------------------------------------------- Patient reports occasional contractions.   Contractions: Irregular. Vag. Bleeding: None.  Movement: Present. No leaking of fluid.  ----------------------------------------------------------------------------------- The following portions of the patient's history were reviewed and updated as appropriate: allergies, current medications, past family history, past medical history, past social history, past surgical history and problem list. Problem list updated.   Objective  Blood pressure 110/80, weight 191 lb (86.6 kg), last menstrual period 05/04/2017. Pregravid weight 160 lb (72.6 kg) Total Weight Gain 31 lb (14.1 kg) Urinalysis: Urine Protein: Negative Urine Glucose: Negative  Fetal Status: Fetal Heart Rate (bpm): 141   Movement: Present     General:  Alert, oriented and cooperative. Patient is in no acute distress.  Skin: Skin is warm and dry. No rash noted.   Cardiovascular: Normal heart rate noted  Respiratory: Normal respiratory effort, no problems with respiration noted  Abdomen: Soft, gravid, appropriate for gestational age. Pain/Pressure: Present     Pelvic:  Cervical exam performed Dilation: Closed Effacement (%): 50 Station: -3  Extremities: Normal range of motion.     Mental Status: Normal mood and affect. Normal behavior. Normal judgment and thought content.     Assessment   27 y.o. G3P0020 at [redacted]w[redacted]d, EDD  02/08/2018 by Last Menstrual Period presenting for routine prenatal  visit.  Plan   pregnancy Problems (from 06/04/17 to present)    Problem Noted Resolved   Migraine 08/02/2017 by Conard Novak, MD No   Overview Signed 08/03/2017 12:52 PM by Conard Novak, MD    Will treat initially with combination of compazine and benadryl.  Further treatment depending on response.       GBS bacteriuria 06/07/2017 by Conard Novak, MD No   Overview Signed 06/07/2017  7:39 AM by Conard Novak, MD    At [facility]. Will need GBS prophylaxis in labor      Supervision of low-risk pregnancy 06/04/2017 by Conard Novak, MD No   Overview Addendum 01/31/2018  3:14 PM by Farrel Conners, CNM    [facility]inic [facility] Prenatal Labs  Dating L=7 Blood type: O/Positive/-- (10/02 1512)   Genetic Screen 1 Screen: neg     AFP:  [ ]  offer 16-18 weeks    Antibody:Positive, See Final Results (10/02 1512); repeat 4/2 was negative  Anatomic [place] Complete @ 19 wks Rubella: Immune  Varicella:  Immune  GTT Third trimester: 43 RPR: Non Reactive (10/02 1512)   Rhogam  HBsAg: Negative (10/02 1512)   TDaP vaccine        12/03/17               Flu Shot: HIV:   Negative  Baby Food       Breast                         GBS: positive 01/04/2018  Contraception  Pap:  CBB   SS screen: negative  CS/VBAC    Support Person          Acute cystitis during pregnancy in first trimester 06/07/2017 by Conard Novak, MD 11/08/2017 by  Nadara MustardHarris, Robert P, MD   Overview Signed 06/07/2017  7:40 AM by Conard NovakJackson, Stephen D, MD    [facility]. [facility] and GBS - tx'd both with keflex       Discussed postdates labor management, IOL scheduled for 6/15.  Term labor symptoms and general obstetric precautions including but not limited to vaginal bleeding, contractions, leaking of fluid and fetal movement were reviewed.  Return in about 3 days (around 02/10/2018) for ROB with NST/AFI.  Marcelyn BruinsJacelyn Schmid, CNM 02/07/2018

## 2018-02-10 ENCOUNTER — Encounter: Payer: Self-pay | Admitting: Maternal Newborn

## 2018-02-10 ENCOUNTER — Ambulatory Visit (INDEPENDENT_AMBULATORY_CARE_PROVIDER_SITE_OTHER): Payer: Medicaid Other

## 2018-02-10 ENCOUNTER — Ambulatory Visit (INDEPENDENT_AMBULATORY_CARE_PROVIDER_SITE_OTHER): Payer: BLUE CROSS/BLUE SHIELD | Admitting: Maternal Newborn

## 2018-02-10 VITALS — BP 120/80 | Wt 195.0 lb

## 2018-02-10 DIAGNOSIS — M545 Low back pain, unspecified: Secondary | ICD-10-CM

## 2018-02-10 DIAGNOSIS — O26893 Other specified pregnancy related conditions, third trimester: Secondary | ICD-10-CM

## 2018-02-10 DIAGNOSIS — O480 Post-term pregnancy: Secondary | ICD-10-CM

## 2018-02-10 DIAGNOSIS — Z3A40 40 weeks gestation of pregnancy: Secondary | ICD-10-CM | POA: Diagnosis not present

## 2018-02-10 DIAGNOSIS — Z369 Encounter for antenatal screening, unspecified: Secondary | ICD-10-CM

## 2018-02-10 DIAGNOSIS — R102 Pelvic and perineal pain: Secondary | ICD-10-CM

## 2018-02-10 DIAGNOSIS — O99820 Streptococcus B carrier state complicating pregnancy: Secondary | ICD-10-CM | POA: Diagnosis not present

## 2018-02-10 DIAGNOSIS — Z3493 Encounter for supervision of normal pregnancy, unspecified, third trimester: Secondary | ICD-10-CM

## 2018-02-10 LAB — POCT URINALYSIS DIPSTICK
BILIRUBIN UA: NEGATIVE
Blood, UA: NEGATIVE
GLUCOSE UA: NEGATIVE
Ketones, UA: NEGATIVE
Nitrite, UA: NEGATIVE
PH UA: 6.5 (ref 5.0–8.0)
Protein, UA: NEGATIVE
Spec Grav, UA: 1.02 (ref 1.010–1.025)
UROBILINOGEN UA: NEGATIVE U/dL — AB

## 2018-02-10 LAB — FETAL NONSTRESS TEST

## 2018-02-10 NOTE — Patient Instructions (Signed)
Vaginal Delivery Vaginal delivery means that you will give birth by pushing your baby out of your birth canal (vagina). A team of health care providers will help you before, during, and after vaginal delivery. Birth experiences are unique for every woman and every pregnancy, and birth experiences vary depending on where you choose to give birth. What should I do to prepare for my baby's birth? Before your baby is born, it is important to talk with your health care provider about:  Your labor and delivery preferences. These may include: ? Medicines that you may be given. ? How you will manage your pain. This might include non-medical pain relief techniques or injectable pain relief such as epidural analgesia. ? How you and your baby will be monitored during labor and delivery. ? Who may be in the labor and delivery room with you. ? Your feelings about surgical delivery of your baby (cesarean delivery, or C-section) if this becomes necessary. ? Your feelings about receiving donated blood through an IV tube (blood transfusion) if this becomes necessary.  Whether you are able: ? To take pictures or videos of the birth. ? To eat during labor and delivery. ? To move around, walk, or change positions during labor and delivery.  What to expect after your baby is born, such as: ? Whether delayed umbilical cord clamping and cutting is offered. ? Who will care for your baby right after birth. ? Medicines or tests that may be recommended for your baby. ? Whether breastfeeding is supported in your hospital or birth center. ? How long you will be in the hospital or birth center.  How any medical conditions you have may affect your baby or your labor and delivery experience.  To prepare for your baby's birth, you should also:  Attend all of your health care visits before delivery (prenatal visits) as recommended by your health care provider. This is important.  Prepare your home for your baby's  arrival. Make sure that you have: ? Diapers. ? Baby clothing. ? Feeding equipment. ? Safe sleeping arrangements for you and your baby.  Install a car seat in your vehicle. Have your car seat checked by a certified car seat installer to make sure that it is installed safely.  Think about who will help you with your new baby at home for at least the first several weeks after delivery.  What can I expect when I arrive at the birth center or hospital? Once you are in labor and have been admitted into the hospital or birth center, your health care provider may:  Review your pregnancy history and any concerns you have.  Insert an IV tube into one of your veins. This is used to give you fluids and medicines.  Check your blood pressure, pulse, temperature, and heart rate (vital signs).  Check whether your bag of water (amniotic sac) has broken (ruptured).  Talk with you about your birth plan and discuss pain control options.  Monitoring Your health care provider may monitor your contractions (uterine monitoring) and your baby's heart rate (fetal monitoring). You may need to be monitored:  Often, but not continuously (intermittently).  All the time or for long periods at a time (continuously). Continuous monitoring may be needed if: ? You are taking certain medicines, such as medicine to relieve pain or make your contractions stronger. ? You have pregnancy or labor complications.  Monitoring may be done by:  Placing a special stethoscope or a handheld monitoring device on your abdomen to   check your baby's heartbeat, and feeling your abdomen for contractions. This method of monitoring does not continuously record your baby's heartbeat or your contractions.  Placing monitors on your abdomen (external monitors) to record your baby's heartbeat and the frequency and length of contractions. You may not have to wear external monitors all the time.  Placing monitors inside of your uterus  (internal monitors) to record your baby's heartbeat and the frequency, length, and strength of your contractions. ? Your health care provider may use internal monitors if he or she needs more information about the strength of your contractions or your baby's heart rate. ? Internal monitors are put in place by passing a thin, flexible wire through your vagina and into your uterus. Depending on the type of monitor, it may remain in your uterus or on your baby's head until birth. ? Your health care provider will discuss the benefits and risks of internal monitoring with you and will ask for your permission before inserting the monitors.  Telemetry. This is a type of continuous monitoring that can be done with external or internal monitors. Instead of having to stay in bed, you are able to move around during telemetry. Ask your health care provider if telemetry is an option for you.  Physical exam Your health care provider may perform a physical exam. This may include:  Checking whether your baby is positioned: ? With the head toward your vagina (head-down). This is most common. ? With the head toward the top of your uterus (head-up or breech). If your baby is in a breech position, your health care provider may try to turn your baby to a head-down position so you can deliver vaginally. If it does not seem that your baby can be born vaginally, your provider may recommend surgery to deliver your baby. In rare cases, you may be able to deliver vaginally if your baby is head-up (breech delivery). ? Lying sideways (transverse). Babies that are lying sideways cannot be delivered vaginally.  Checking your cervix to determine: ? Whether it is thinning out (effacing). ? Whether it is opening up (dilating). ? How low your baby has moved into your birth canal.  What are the three stages of labor and delivery?  Normal labor and delivery is divided into the following three stages: Stage 1  Stage 1 is the  longest stage of labor, and it can last for hours or days. Stage 1 includes: ? Early labor. This is when contractions may be irregular, or regular and mild. Generally, early labor contractions are more than 10 minutes apart. ? Active labor. This is when contractions get longer, more regular, more frequent, and more intense. ? The transition phase. This is when contractions happen very close together, are very intense, and may last longer than during any other part of labor.  Contractions generally feel mild, infrequent, and irregular at first. They get stronger, more frequent (about every 2-3 minutes), and more regular as you progress from early labor through active labor and transition.  Many women progress through stage 1 naturally, but you may need help to continue making progress. If this happens, your health care provider may talk with you about: ? Rupturing your amniotic sac if it has not ruptured yet. ? Giving you medicine to help make your contractions stronger and more frequent.  Stage 1 ends when your cervix is completely dilated to 4 inches (10 cm) and completely effaced. This happens at the end of the transition phase. Stage 2  Once   your cervix is completely effaced and dilated to 4 inches (10 cm), you may start to feel an urge to push. It is common for the body to naturally take a rest before feeling the urge to push, especially if you received an epidural or certain other pain medicines. This rest period may last for up to 1-2 hours, depending on your unique labor experience.  During stage 2, contractions are generally less painful, because pushing helps relieve contraction pain. Instead of contraction pain, you may feel stretching and burning pain, especially when the widest part of your baby's head passes through the vaginal opening (crowning).  Your health care provider will closely monitor your pushing progress and your baby's progress through the vagina during stage 2.  Your  health care provider may massage the area of skin between your vaginal opening and anus (perineum) or apply warm compresses to your perineum. This helps it stretch as the baby's head starts to crown, which can help prevent perineal tearing. ? In some cases, an incision may be made in your perineum (episiotomy) to allow the baby to pass through the vaginal opening. An episiotomy helps to make the opening of the vagina larger to allow more room for the baby to fit through.  It is very important to breathe and focus so your health care provider can control the delivery of your baby's head. Your health care provider may have you decrease the intensity of your pushing, to help prevent perineal tearing.  After delivery of your baby's head, the shoulders and the rest of the body generally deliver very quickly and without difficulty.  Once your baby is delivered, the umbilical cord may be cut right away, or this may be delayed for 1-2 minutes, depending on your baby's health. This may vary among health care providers, hospitals, and birth centers.  If you and your baby are healthy enough, your baby may be placed on your chest or abdomen to help maintain the baby's temperature and to help you bond with each other. Some mothers and babies start breastfeeding at this time. Your health care team will dry your baby and help keep your baby warm during this time.  Your baby may need immediate care if he or she: ? Showed signs of distress during labor. ? Has a medical condition. ? Was born too early (prematurely). ? Had a bowel movement before birth (meconium). ? Shows signs of difficulty transitioning from being inside the uterus to being outside of the uterus. If you are planning to breastfeed, your health care team will help you begin a feeding. Stage 3  The third stage of labor starts immediately after the birth of your baby and ends after you deliver the placenta. The placenta is an organ that develops  during pregnancy to provide oxygen and nutrients to your baby in the womb.  Delivering the placenta may require some pushing, and you may have mild contractions. Breastfeeding can stimulate contractions to help you deliver the placenta.  After the placenta is delivered, your uterus should tighten (contract) and become firm. This helps to stop bleeding in your uterus. To help your uterus contract and to control bleeding, your health care provider may: ? Give you medicine by injection, through an IV tube, by mouth, or through your rectum (rectally). ? Massage your abdomen or perform a vaginal exam to remove any blood clots that are left in your uterus. ? Empty your bladder by placing a thin, flexible tube (catheter) into your bladder. ? Encourage   you to breastfeed your baby. After labor is over, you and your baby will be monitored closely to ensure that you are both healthy until you are ready to go home. Your health care team will teach you how to care for yourself and your baby. This information is not intended to replace advice given to you by your health care provider. Make sure you discuss any questions you have with your health care provider. Document Released: 05/29/2008 Document Revised: 03/09/2016 Document Reviewed: 09/04/2015 [employer] Interactive Patient Education  2018 [employer]  

## 2018-02-10 NOTE — Progress Notes (Signed)
Routine Prenatal Care Visit  Subjective  Colleen Peterson is a 27 y.o. G3P0020 at 4174w2d being seen today for ongoing prenatal care.  She is currently monitored for the following issues for this low-risk pregnancy and has Cystic acne vulgaris; Supervision of low-risk pregnancy; GBS bacteriuria; Migraine; Heartburn during pregnancy in third trimester; and Anemia of pregnancy in third trimester on their problem list.  ----------------------------------------------------------------------------------- Patient reports backache, contractions around 10 minutes apart and pelvic pressure. Contractions: Irregular. Vag. Bleeding: None.  Movement: Present. No leaking of fluid.  ----------------------------------------------------------------------------------- The following portions of the patient's history were reviewed and updated as appropriate: allergies, current medications, past family history, past medical history, past social history, past surgical history and problem list. Problem list updated.  Objective  Blood pressure 120/80, weight 195 lb (88.5 kg), last menstrual period 05/04/2017. Pregravid weight 160 lb (72.6 kg) Total Weight Gain 35 lb (15.9 kg) Urinalysis: Urine Protein: Negative Urine Glucose: Negative  Fetal Status: Fetal Heart Rate (bpm): 135 Fundal Height: 38 cm Movement: Present  Presentation: Vertex  General:  Alert, oriented and cooperative. Patient is in no acute distress.  Skin: Skin is warm and dry. No rash noted.   Cardiovascular: Normal heart rate noted  Respiratory: Normal respiratory effort, no problems with respiration noted  Abdomen: Soft, gravid, appropriate for gestational age. Pain/Pressure: Present     Pelvic:  Cervical exam performed Dilation: Closed Effacement (%): 50, 60 Station: -2  Extremities: Normal range of motion.     Mental Status: Normal mood and affect. Normal behavior. Normal judgment and thought content.   NST  Baseline: 135 Variability:  moderate Accelerations: present Decelerations: absent Tocometry: none The patient was monitored for 20 minutes, fetal heart rate tracing was deemed reactive, category I tracing.  Assessment   27 y.o. G3P0020 at 3174w2d, EDD  02/08/2018 by Last Menstrual Period presenting for routine prenatal visit.  Plan   pregnancy Problems (from 06/04/17 to present)    Problem Noted Resolved   Migraine 08/02/2017 by Conard NovakJackson, Stephen D, MD No   Overview Signed 08/03/2017 12:52 PM by Conard NovakJackson, Stephen D, MD    Will treat initially with combination of compazine and benadryl.  Further treatment depending on response.       GBS bacteriuria 06/07/2017 by Conard NovakJackson, Stephen D, MD No   Overview Signed 06/07/2017  7:39 AM by Conard NovakJackson, Stephen D, MD    At [facility]. Will need GBS prophylaxis in labor      Supervision of low-risk pregnancy 06/04/2017 by Conard NovakJackson, Stephen D, MD No   Overview Addendum 01/31/2018  3:14 PM by Farrel ConnersGutierrez, Colleen, CNM    [facility]inic [facility] Prenatal Labs  Dating L=7 Blood type: O/Positive/-- (10/02 1512)   Genetic Screen 1 Screen: neg     AFP:  [ ]  offer 16-18 weeks    Antibody:Positive, See Final Results (10/02 1512); repeat 4/2 was negative  Anatomic US Complete @ 19 wks Rubella: Immune  Varicella:  Immune  GTT Third trimester: 43 RPR: Non Reactive (10/02 1512)   Rhogam  HBsAg: Negative (10/02 1512)   TDaP vaccine        12/03/17               Flu Shot: HIV:   Negative  Baby Food       Breast                         GBS: positive 01/04/2018  Contraception  Pap:  CBB  SS screen: negative  CS/VBAC    Support Person          Acute cystitis during pregnancy in first trimester 06/07/2017 by Conard Novak, MD 11/08/2017 by Nadara Mustard, MD   Overview Signed 06/07/2017  7:40 AM by Conard Novak, MD    At [facility] with e. Coli and GBS - tx'd both with keflex       Reactive NST. AFI 5.85, low normal, with 2 x 2 pocket.  Term labor symptoms and general obstetric precautions including but not  limited to vaginal bleeding, contractions, leaking of fluid and fetal movement were reviewed.  Please refer to After Visit Summary for other counseling recommendations.   Keep scheduled ROB with NST and AFI 02/12/18, IOL on 02/15/2018 if labor has not begun.  [name], CNM 02/10/2018  11:57 AM

## 2018-02-10 NOTE — Progress Notes (Signed)
C/o in a lot of constant pelvic pain; ctxs apart; back hurting really bad.rj

## 2018-02-12 ENCOUNTER — Other Ambulatory Visit: Payer: Self-pay

## 2018-02-12 ENCOUNTER — Inpatient Hospital Stay
Admission: EM | Admit: 2018-02-12 | Discharge: 2018-02-15 | DRG: 787 | Disposition: A | Discharge status: Home / Self Care | Payer: Medicaid Other | Attending: Maternal Newborn | Admitting: Maternal Newborn

## 2018-02-12 ENCOUNTER — Ambulatory Visit (INDEPENDENT_AMBULATORY_CARE_PROVIDER_SITE_OTHER): Payer: Medicaid Other

## 2018-02-12 ENCOUNTER — Ambulatory Visit (INDEPENDENT_AMBULATORY_CARE_PROVIDER_SITE_OTHER): Payer: Medicaid Other | Admitting: Certified Nurse Midwife

## 2018-02-12 VITALS — BP 118/72 | Wt 194.0 lb

## 2018-02-12 DIAGNOSIS — Z369 Encounter for antenatal screening, unspecified: Secondary | ICD-10-CM

## 2018-02-12 DIAGNOSIS — R8271 Bacteriuria: Secondary | ICD-10-CM

## 2018-02-12 DIAGNOSIS — Z3493 Encounter for supervision of normal pregnancy, unspecified, third trimester: Secondary | ICD-10-CM

## 2018-02-12 DIAGNOSIS — O99824 Streptococcus B carrier state complicating childbirth: Secondary | ICD-10-CM | POA: Diagnosis present

## 2018-02-12 DIAGNOSIS — R12 Heartburn: Secondary | ICD-10-CM | POA: Diagnosis present

## 2018-02-12 DIAGNOSIS — Z3A40 40 weeks gestation of pregnancy: Secondary | ICD-10-CM

## 2018-02-12 DIAGNOSIS — O9081 Anemia of the puerperium: Secondary | ICD-10-CM | POA: Diagnosis not present

## 2018-02-12 DIAGNOSIS — O4103X0 Oligohydramnios, third trimester, not applicable or unspecified: Secondary | ICD-10-CM

## 2018-02-12 DIAGNOSIS — O9962 Diseases of the digestive system complicating childbirth: Secondary | ICD-10-CM | POA: Diagnosis present

## 2018-02-12 DIAGNOSIS — O4100X0 Oligohydramnios, unspecified trimester, not applicable or unspecified: Secondary | ICD-10-CM | POA: Diagnosis present

## 2018-02-12 DIAGNOSIS — G43009 Migraine without aura, not intractable, without status migrainosus: Secondary | ICD-10-CM

## 2018-02-12 DIAGNOSIS — D62 Acute posthemorrhagic anemia: Secondary | ICD-10-CM | POA: Diagnosis not present

## 2018-02-12 LAB — CBC
HCT: 33.4 % — ABNORMAL LOW (ref 35.0–47.0)
Hemoglobin: 11.5 g/dL — ABNORMAL LOW (ref 12.0–16.0)
MCH: 31.5 pg (ref 26.0–34.0)
MCHC: 34.4 g/dL (ref 32.0–36.0)
MCV: 91.6 fL (ref 80.0–100.0)
Platelets: 163 10*3/uL (ref 150–440)
RBC: 3.65 MIL/uL — AB (ref 3.80–5.20)
RDW: 14 % (ref 11.5–14.5)
WBC: 10.7 10*3/uL (ref 3.6–11.0)

## 2018-02-12 LAB — FETAL NONSTRESS TEST

## 2018-02-12 LAB — TYPE AND SCREEN
ABO/RH(D): O POS
Antibody Screen: NEGATIVE

## 2018-02-12 MED ORDER — MISOPROSTOL 25 MCG QUARTER TABLET
25.0000 ug | ORAL_TABLET | ORAL | Status: DC | PRN
  Administered 2018-02-12: 25 ug via VAGINAL
  Filled 2018-02-12: qty 1

## 2018-02-12 MED ORDER — LACTATED RINGERS IV SOLN
500.0000 mL | INTRAVENOUS | Status: DC | PRN
  Administered 2018-02-12: 500 mL via INTRAVENOUS

## 2018-02-12 MED ORDER — PENICILLIN G POT IN DEXTROSE 60000 UNIT/ML IV SOLN
3.0000 10*6.[IU] | INTRAVENOUS | Status: DC
  Filled 2018-02-12 (×6): qty 50

## 2018-02-12 MED ORDER — ACETAMINOPHEN 325 MG PO TABS: 650.0000 mg | ORAL_TABLET | ORAL | Status: DC | PRN

## 2018-02-12 MED ORDER — SODIUM CHLORIDE 0.9 % IV SOLN
5.0000 10*6.[IU] | Freq: Once | INTRAVENOUS | Status: AC
  Administered 2018-02-13: 5 10*6.[IU] via INTRAVENOUS
  Filled 2018-02-12: qty 5

## 2018-02-12 MED ORDER — OXYTOCIN 40 UNITS IN LACTATED RINGERS INFUSION - SIMPLE MED
2.5000 [IU]/h | INTRAVENOUS | Status: DC
  Filled 2018-02-12: qty 1000

## 2018-02-12 MED ORDER — FENTANYL CITRATE (PF) 100 MCG/2ML IJ SOLN
50.0000 ug | INTRAMUSCULAR | Status: DC | PRN
  Administered 2018-02-12 (×3): 100 ug via INTRAVENOUS
  Filled 2018-02-12 (×3): qty 2

## 2018-02-12 MED ORDER — TERBUTALINE SULFATE 1 MG/ML IJ SOLN
0.2500 mg | Freq: Once | INTRAMUSCULAR | Status: DC | PRN
  Filled 2018-02-12: qty 1

## 2018-02-12 MED ORDER — LACTATED RINGERS IV SOLN
INTRAVENOUS | Status: DC
Start: 2018-02-12 — End: 2018-02-13
  Administered 2018-02-12 – 2018-02-13 (×3): via INTRAVENOUS

## 2018-02-12 MED ORDER — STERILE WATER FOR INJECTION IJ SOLN
INTRAMUSCULAR | Status: AC
  Filled 2018-02-12: qty 50

## 2018-02-12 MED ORDER — OXYTOCIN 40 UNITS IN LACTATED RINGERS INFUSION - SIMPLE MED: 1.0000 m[IU]/min | INTRAVENOUS | Status: DC

## 2018-02-12 MED ORDER — OXYTOCIN BOLUS FROM INFUSION: 500.0000 mL | Freq: Once | INTRAVENOUS | Status: DC

## 2018-02-12 MED ORDER — ONDANSETRON HCL 4 MG/2ML IJ SOLN
4.0000 mg | Freq: Four times a day (QID) | INTRAMUSCULAR | Status: DC | PRN
  Administered 2018-02-12: 4 mg via INTRAVENOUS
  Filled 2018-02-12: qty 2

## 2018-02-12 NOTE — Progress Notes (Signed)
ROB at 40 wk4d: Baby active. Irregular contractions. C/O back and pelvic pain AFI today 4.73cm NST reactive with baseline 145 and accelerations to 160s, moderate variability. One possible mild variable deceleration to 120s. Sent to L&D for IOL for oligo Colleen Peterson, CNM

## 2018-02-12 NOTE — Progress Notes (Signed)
  Labor Progress Note   27 y.o. [id number] @ 6180w4d , admitted for  Pregnancy, Labor Management. Oligohydramnios; Induction of Labor  Subjective:  Breathing through contractions. IV pain medication helped her feel less uncomfortable.   Objective:  BP 132/86   Pulse 86   Temp 98.8 F (37.1 C) (Oral)   Resp 18   Ht 5\' 4"  (1.626 m)   Wt 195 lb (88.5 kg)   LMP 05/04/2017   SpO2 100%   BMI 33.47 kg/m  Abd: moderate Extr: trace to 1+ bilateral pedal edema SVE: 1.5/70/-2 to -3  EFM: FHR: 125 bpm, variability: moderate, accelerations: Present,  Decelerations: Absent Toco: Frequency 1.5-3 minutes, intensity moderate Labs: I have reviewed the patient's lab results.   Assessment & Plan:  [id number] @ 7180w4d, admitted for  Pregnancy and Labor/Delivery Management  1. Pain management: IV sedation. 2. FWB: FHT category I.  3. ID: GBS positive 4. Labor management: Foley bulb placed. Continue to monitor contraction pattern.  All discussed with patient, see orders.  Marcelyn BruinsJacelyn Schmid, CNM 02/12/2018

## 2018-02-12 NOTE — Progress Notes (Signed)
Pt c/o back pain. Unsure if having ctx in her back. Also c/o pelvic pain. AFI/NST today. Pt is concerned about baby's fluid level.

## 2018-02-12 NOTE — H&P (Signed)
Obstetrics Admission History & Physical   Oligohydramnios   HPI:  27 y.o. [id number] @ [redacted]w[redacted]d (02/08/2018, by Last Menstrual Period). Admitted on 02/12/2018:   Patient Active Problem List   Diagnosis Date Noted  . Oligohydramnios 02/12/2018  . Anemia of pregnancy in third trimester 11/20/2017  . Heartburn during pregnancy in third trimester 10/15/2017  . Migraine 08/02/2017  . GBS bacteriuria 06/07/2017  . Supervision of low-risk pregnancy 06/04/2017  . Cystic acne vulgaris 01/04/2015    Presents for induction of labor for oligohydramnios seen today on ultrasound at her prenatal visit. AFI=4.73 cm. She has been having some mildly painful contractions at home. No headaches, loss of fluid, or vaginal bleeding. Good fetal movement.   Prenatal care at: at [facility] Pregnancy complicated by anemia and Group B strep.  ROS: A review of systems was performed and negative, except as stated in the above HPI.  PMHx:  Past Medical History:  Diagnosis Date  . BV (bacterial vaginosis)   . Trichomoniasis 05/2017   PSHx:  Past Surgical History:  Procedure Laterality Date  . NO PAST SURGERIES     Medications:  Medications Prior to Admission  Medication Sig Dispense Refill Last Dose  . Prenat-FeFmCb-DSS-FA-DHA w/o A (CITRANATAL HARMONY) 27-1-260 MG CAPS Take 1 capsule by mouth daily. 30 capsule 4 02/11/2018 at Unknown time  . ranitidine (ZANTAC 75) 75 MG tablet Take 1 tablet (75 mg total) by mouth 2 (two) times daily. 60 tablet 3 02/11/2018 at Unknown time   Allergies: has No Known Allergies. OBHx:  OB History  Gravida Para Term Preterm AB Living  3       2    SAB TAB Ectopic Multiple Live Births  1            # Outcome Date GA Lbr Len/2nd Weight Sex Delivery Anes PTL Lv  3 Current           2 AB 09/2016          1 SAB 2011           GMW:NUUVO than listed in HPI remarkable for: breast cancer mother.  No family history of birth defects. Soc Hx: Never smoker, Alcohol: socially prior to  pregnancy, Recreational drug use: none, and Pregnancy welcomed.  Objective:   Vitals:   02/12/18 1227  BP: 116/69  Pulse: 89  Resp: 16  Temp: 98.3 F (36.8 C)  SpO2: 100%   Constitutional: Well nourished, well developed female in no acute distress.  HEENT: normal Skin: Warm and dry Cardiovascular: Regular rate and rhythm Extremity: trace bilateral pedal edema Respiratory: Clear to auscultation bilaterally. Normal respiratory effort Abdomen: gravid, mild tenderness with contractions Neuro: DTRs 2+, Cranial nerves grossly intact Psych: Alert and Oriented x3. No memory deficits. Normal mood and affect.  MS: normal gait, normal bilateral lower extremity ROM/strength/stability.  Pelvic exam: is not limited by body habitus External Genitalia, Bartholin's glands, Urethra, Skene's glands: within normal limits Vagina: within normal limits and with normal mucosa, no blood in the vault Cervix: 1/50/ballotable per RN exam Uterus: Spontaneous uterine activity  Adnexa: not evaluated  EFM: FHR: 125 bpm, variability: moderate,  accelerations:  Present,  decelerations:  Absent Toco: occasional contractions   Perinatal info:  Blood type: O positive Rubella - Immune Varicella - Immune TDaP - Given during third trimester of this pregnancy RPR NR / HIV Neg/ HBsAg Neg   Assessment & Plan:   27 y.o. [id number] @ [redacted]w[redacted]d, Admitted on 02/12/2018: for planned induction of labor  due to oligohydramnios.  Admit for labor, Antibiotics for GBS prophylaxis, Observe for cervical change, Fetal Wellbeing Reassuring, Epidural when ready and AROM when Appropriate   Begin labor induction with Cytotec, plan reviewed with patient and questions answered.  Colleen Peterson, CNM [facility], [facility] 02/12/2018  1:30 PM

## 2018-02-13 ENCOUNTER — Encounter
Admission: EM | Disposition: A | Discharge status: Home / Self Care | Payer: Self-pay | Source: Home / Self Care | Attending: Maternal Newborn

## 2018-02-13 ENCOUNTER — Inpatient Hospital Stay: Payer: Medicaid Other | Admitting: Anesthesiology

## 2018-02-13 DIAGNOSIS — Z3A40 40 weeks gestation of pregnancy: Secondary | ICD-10-CM

## 2018-02-13 DIAGNOSIS — O4103X0 Oligohydramnios, third trimester, not applicable or unspecified: Secondary | ICD-10-CM

## 2018-02-13 LAB — RPR: RPR: NONREACTIVE

## 2018-02-13 SURGERY — Surgical Case
Anesthesia: Epidural | Site: Abdomen | Wound class: Clean Contaminated

## 2018-02-13 MED ORDER — PRENATAL MULTIVITAMIN CH
1.0000 | ORAL_TABLET | Freq: Every day | ORAL | Status: DC
  Administered 2018-02-13 – 2018-02-15 (×3): 1 via ORAL
  Filled 2018-02-13 (×3): qty 1

## 2018-02-13 MED ORDER — BUPIVACAINE ON-Q PAIN PUMP (FOR ORDER SET NO CHG)
INJECTION | Status: DC
  Filled 2018-02-13: qty 1

## 2018-02-13 MED ORDER — DIPHENHYDRAMINE HCL 50 MG/ML IJ SOLN
12.5000 mg | INTRAMUSCULAR | Status: DC | PRN
  Administered 2018-02-13: 12.5 mg via INTRAVENOUS
  Filled 2018-02-13: qty 1

## 2018-02-13 MED ORDER — SODIUM CHLORIDE 0.9 % IV SOLN
500.0000 mg | Freq: Once | INTRAVENOUS | Status: AC
  Administered 2018-02-13: 500 mg via INTRAVENOUS
  Filled 2018-02-13: qty 500

## 2018-02-13 MED ORDER — BUPIVACAINE 0.25 % ON-Q PUMP SINGLE CATH 400 ML
400.0000 mL | INJECTION | Status: DC
  Filled 2018-02-13: qty 400

## 2018-02-13 MED ORDER — DIPHENHYDRAMINE HCL 25 MG PO CAPS: 25.0000 mg | ORAL_CAPSULE | Freq: Four times a day (QID) | ORAL | Status: DC | PRN

## 2018-02-13 MED ORDER — ACETAMINOPHEN 325 MG PO TABS
650.0000 mg | ORAL_TABLET | ORAL | Status: DC | PRN
Start: 2018-02-13 — End: 2018-02-15

## 2018-02-13 MED ORDER — OXYCODONE-ACETAMINOPHEN 5-325 MG PO TABS
2.0000 | ORAL_TABLET | ORAL | Status: DC | PRN
  Administered 2018-02-13 – 2018-02-14 (×4): 2 via ORAL
  Filled 2018-02-13 (×4): qty 2

## 2018-02-13 MED ORDER — ONDANSETRON HCL 4 MG/2ML IJ SOLN
INTRAMUSCULAR | Status: DC | PRN
  Administered 2018-02-13: 4 mg via INTRAVENOUS

## 2018-02-13 MED ORDER — OXYTOCIN 40 UNITS IN LACTATED RINGERS INFUSION - SIMPLE MED
INTRAVENOUS | Status: DC | PRN
  Administered 2018-02-13: 1 mL via INTRAVENOUS
  Administered 2018-02-13: 500 mL via INTRAVENOUS

## 2018-02-13 MED ORDER — FENTANYL 2.5 MCG/ML W/ROPIVACAINE 0.15% IN NS 100 ML EPIDURAL (ARMC)
EPIDURAL | Status: AC
  Filled 2018-02-13: qty 100

## 2018-02-13 MED ORDER — OXYCODONE-ACETAMINOPHEN 5-325 MG PO TABS
1.0000 | ORAL_TABLET | ORAL | Status: DC | PRN
  Administered 2018-02-13 – 2018-02-15 (×3): 1 via ORAL
  Filled 2018-02-13 (×3): qty 1

## 2018-02-13 MED ORDER — EPHEDRINE 5 MG/ML INJ: 10.0000 mg | INTRAVENOUS | Status: DC | PRN

## 2018-02-13 MED ORDER — SIMETHICONE 80 MG PO CHEW
80.0000 mg | CHEWABLE_TABLET | ORAL | Status: DC
  Filled 2018-02-13: qty 1

## 2018-02-13 MED ORDER — LACTATED RINGERS IV SOLN: INTRAVENOUS | Status: DC

## 2018-02-13 MED ORDER — KETOROLAC TROMETHAMINE 30 MG/ML IJ SOLN
30.0000 mg | Freq: Four times a day (QID) | INTRAMUSCULAR | Status: AC | PRN
  Administered 2018-02-13 – 2018-02-14 (×4): 30 mg via INTRAVENOUS
  Filled 2018-02-13 (×4): qty 1

## 2018-02-13 MED ORDER — TETANUS-DIPHTH-ACELL PERTUSSIS 5-2.5-18.5 LF-MCG/0.5 IM SUSP: 0.5000 mL | Freq: Once | INTRAMUSCULAR | Status: DC

## 2018-02-13 MED ORDER — PHENYLEPHRINE 40 MCG/ML (10ML) SYRINGE FOR IV PUSH (FOR BLOOD PRESSURE SUPPORT): 80.0000 ug | PREFILLED_SYRINGE | INTRAVENOUS | Status: DC | PRN

## 2018-02-13 MED ORDER — SIMETHICONE 80 MG PO CHEW: 80.0000 mg | CHEWABLE_TABLET | ORAL | Status: DC | PRN

## 2018-02-13 MED ORDER — FENTANYL 2.5 MCG/ML W/ROPIVACAINE 0.15% IN NS 100 ML EPIDURAL (ARMC)
12.0000 mL/h | EPIDURAL | Status: DC
  Administered 2018-02-13: 12 mL/h via EPIDURAL

## 2018-02-13 MED ORDER — FENTANYL CITRATE (PF) 100 MCG/2ML IJ SOLN
25.0000 ug | INTRAMUSCULAR | Status: DC | PRN
  Administered 2018-02-13 (×4): 25 ug via INTRAVENOUS
  Filled 2018-02-13: qty 2

## 2018-02-13 MED ORDER — BUPIVACAINE HCL (PF) 0.25 % IJ SOLN
INTRAMUSCULAR | Status: DC | PRN
Start: 2018-02-13 — End: 2018-02-13
  Administered 2018-02-13: 5 mL via EPIDURAL

## 2018-02-13 MED ORDER — LACTATED RINGERS IV SOLN: 500.0000 mL | Freq: Once | INTRAVENOUS | Status: DC

## 2018-02-13 MED ORDER — EPHEDRINE 5 MG/ML INJ
INTRAVENOUS | Status: AC
  Administered 2018-02-13: 5 mg
  Filled 2018-02-13: qty 4

## 2018-02-13 MED ORDER — OXYTOCIN 40 UNITS IN LACTATED RINGERS INFUSION - SIMPLE MED
2.5000 [IU]/h | INTRAVENOUS | Status: AC
  Administered 2018-02-13: 2.5 [IU]/h via INTRAVENOUS
  Filled 2018-02-13 (×2): qty 1000

## 2018-02-13 MED ORDER — WITCH HAZEL-GLYCERIN EX PADS: 1.0000 "application " | MEDICATED_PAD | CUTANEOUS | Status: DC | PRN

## 2018-02-13 MED ORDER — SIMETHICONE 80 MG PO CHEW
80.0000 mg | CHEWABLE_TABLET | Freq: Three times a day (TID) | ORAL | Status: DC
  Administered 2018-02-13 – 2018-02-15 (×6): 80 mg via ORAL
  Filled 2018-02-13 (×5): qty 1

## 2018-02-13 MED ORDER — ONDANSETRON HCL 4 MG/2ML IJ SOLN: 4.0000 mg | Freq: Once | INTRAMUSCULAR | Status: DC | PRN

## 2018-02-13 MED ORDER — COCONUT OIL OIL: 1.0000 "application " | TOPICAL_OIL | Status: DC | PRN

## 2018-02-13 MED ORDER — SOD CITRATE-CITRIC ACID 500-334 MG/5ML PO SOLN
30.0000 mL | ORAL | Status: AC
  Administered 2018-02-13: 30 mL via ORAL
  Filled 2018-02-13: qty 15

## 2018-02-13 MED ORDER — CEFAZOLIN SODIUM-DEXTROSE 2-4 GM/100ML-% IV SOLN
2.0000 g | INTRAVENOUS | Status: DC
  Filled 2018-02-13: qty 100

## 2018-02-13 MED ORDER — DIBUCAINE 1 % RE OINT: 1.0000 "application " | TOPICAL_OINTMENT | RECTAL | Status: DC | PRN

## 2018-02-13 MED ORDER — SENNOSIDES-DOCUSATE SODIUM 8.6-50 MG PO TABS
2.0000 | ORAL_TABLET | ORAL | Status: DC
  Administered 2018-02-14 – 2018-02-15 (×2): 2 via ORAL
  Filled 2018-02-13 (×2): qty 2

## 2018-02-13 MED ORDER — BUPIVACAINE HCL (PF) 0.5 % IJ SOLN
20.0000 mL | INTRAMUSCULAR | Status: DC
  Filled 2018-02-13: qty 30

## 2018-02-13 MED ORDER — MENTHOL 3 MG MT LOZG
1.0000 | LOZENGE | OROMUCOSAL | Status: DC | PRN
  Filled 2018-02-13: qty 9

## 2018-02-13 MED ORDER — CEFAZOLIN SODIUM-DEXTROSE 2-3 GM-%(50ML) IV SOLR
INTRAVENOUS | Status: DC | PRN
  Administered 2018-02-13: 2 g via INTRAVENOUS

## 2018-02-13 SURGICAL SUPPLY — 33 items
ADH SKN CLS APL DERMABOND .7 (GAUZE/BANDAGES/DRESSINGS)
BAG COUNTER SPONGE EZ (MISCELLANEOUS) ×3 IMPLANT
BAG SPNG 4X4 CLR HAZ (MISCELLANEOUS) ×2
CANISTER SUCT 3000ML PPV (MISCELLANEOUS) ×3 IMPLANT
CATH KIT ON-Q SILVERSOAK 5 (CATHETERS) ×2 IMPLANT
CATH KIT ON-Q SILVERSOAK 5IN (CATHETERS) ×3 IMPLANT
CHLORAPREP W/TINT 26ML (MISCELLANEOUS) ×6 IMPLANT
CLOSURE WOUND 1/2 X4 (GAUZE/BANDAGES/DRESSINGS)
COUNTER SPONGE BAG EZ (MISCELLANEOUS) ×2
DERMABOND ADVANCED (GAUZE/BANDAGES/DRESSINGS)
DERMABOND ADVANCED .7 DNX12 (GAUZE/BANDAGES/DRESSINGS) ×1 IMPLANT
DRSG OPSITE POSTOP 4X10 (GAUZE/BANDAGES/DRESSINGS) ×3 IMPLANT
DRSG TELFA 3X8 NADH (GAUZE/BANDAGES/DRESSINGS) IMPLANT
ELECT CAUTERY BLADE 6.4 (BLADE) ×3 IMPLANT
ELECT REM PT RETURN 9FT ADLT (ELECTROSURGICAL) ×3
ELECTRODE REM PT RTRN 9FT ADLT (ELECTROSURGICAL) ×1 IMPLANT
GAUZE SPONGE 4X4 12PLY STRL (GAUZE/BANDAGES/DRESSINGS) ×1 IMPLANT
GLOVE BIO SURGEON STRL SZ7 (GLOVE) ×7 IMPLANT
GLOVE INDICATOR 7.5 STRL GRN (GLOVE) ×7 IMPLANT
GOWN STRL REUS W/ TWL LRG LVL3 (GOWN DISPOSABLE) ×3 IMPLANT
GOWN STRL REUS W/TWL LRG LVL3 (GOWN DISPOSABLE) ×9
NS IRRIG 1000ML POUR BTL (IV SOLUTION) ×3 IMPLANT
PACK C SECTION AR (MISCELLANEOUS) ×3 IMPLANT
PAD DRESSING TELFA 3X8 NADH (GAUZE/BANDAGES/DRESSINGS) ×1 IMPLANT
PAD OB MATERNITY 4.3X12.25 (PERSONAL CARE ITEMS) ×5 IMPLANT
PAD PREP 24X41 OB/GYN DISP (PERSONAL CARE ITEMS) ×3 IMPLANT
STAPLER INSORB 30 2030 C-SECTI (MISCELLANEOUS) ×2 IMPLANT
STRIP CLOSURE SKIN 1/2X4 (GAUZE/BANDAGES/DRESSINGS) ×1 IMPLANT
SUT MNCRL AB 4-0 PS2 18 (SUTURE) ×3 IMPLANT
SUT PDS AB 1 TP1 96 (SUTURE) ×4 IMPLANT
SUT VIC AB 0 CTX 36 (SUTURE) ×6
SUT VIC AB 0 CTX36XBRD ANBCTRL (SUTURE) ×2 IMPLANT
SUT VIC AB 2-0 CT1 36 (SUTURE) ×3 IMPLANT

## 2018-02-13 NOTE — Anesthesia Preprocedure Evaluation (Signed)
Anesthesia Evaluation  Patient identified by MRN, date of birth, ID band Patient awake    Reviewed: Allergy & Precautions, NPO status , Patient's Chart, lab work & pertinent test results, reviewed documented beta blocker date and time   Airway Mallampati: II  TM Distance: >3 FB     Dental  (+) Chipped   Pulmonary           Cardiovascular      Neuro/Psych  Headaches,    GI/Hepatic   Endo/Other    Renal/GU      Musculoskeletal   Abdominal   Peds  Hematology  (+) anemia ,   Anesthesia Other Findings   Reproductive/Obstetrics                             Anesthesia Physical Anesthesia Plan  ASA: II  Anesthesia Plan: Epidural   Post-op Pain Management:    Induction:   PONV Risk Score and Plan:   Airway Management Planned:   Additional Equipment:   Intra-op Plan:   Post-operative Plan:   Informed Consent: I have reviewed the patients History and Physical, chart, labs and discussed the procedure including the risks, benefits and alternatives for the proposed anesthesia with the patient or authorized representative who has indicated his/her understanding and acceptance.     Plan Discussed with: CRNA  Anesthesia Plan Comments:         Anesthesia Quick Evaluation

## 2018-02-13 NOTE — Anesthesia Post-op Follow-up Note (Signed)
Anesthesia QCDR form completed.        

## 2018-02-13 NOTE — Op Note (Signed)
Preoperative Diagnosis: 1) 27 y.o. G3P0020 at [redacted]w[redacted]d 2) Oligohydramnios 3) Fetal intolerance to labor  Postoperative Diagnosis: 1) 27 y.o. [id number] at [redacted]w[redacted]d 2) Oligohydramnios 3) Fetal intolerance to labor   Operation Performed: Primary low transverse C-section via pfannenstiel skin incision  Indication: Fetal intolerance to labor  Anesthesia: .Epidural  Primary Surgeon: Vena Austria, MD  Assistant: Maryruth Eve, CNM  Preoperative Antibiotics: 2g Ancef and 500mg  Azithromycin  Estimated Blood Loss: 300 mL  IV Fluids:  Drains or Tubes: Foley to gravity drainage, ON-Q catheter system  Implants: none  Specimens Removed: none  Complications: none  Intraoperative Findings:  Normal tubes ovaries and uterus.  Delivery resulted in the birth of a liveborn female, APGAR (1 MIN): 9   APGAR (5 MINS): 9   Weight 7lbs 3oz  Patient Condition: stable  Procedure in Detail:  Patient was taken to the operating room were she was administered regional anesthesia.  She was positioned in the supine position, prepped and draped in the  Usual sterile fashion.  Prior to proceeding with the case a time out was performed and the level of anesthetic was checked and noted to be adequate.  Utilizing the scalpel a pfannenstiel skin incision was made 2cm above the pubic symphysis and carried down sharply to the the level of the rectus fascia.  The fascia was incised in the midline using the scalpel and then extended using mayo scissors.  The superior border of the rectus fascia was grasped with two Kocher clamps and the underlying rectus muscles were dissected of the fascia using blunt dissection.  The median raphae was incised using Mayo scissors.   The inferior border of the rectus fascia was dissected of the rectus muscles in a similar fashion.  The midline was identified, the peritoneum was entered bluntly and expanded using manual tractions.  The uterus was noted to be in a none rotated  position.  Next the bladder blade was placed retracting the bladder caudally.  A bladder flap was not created.  A low transverse incision was scored on the lower uterine segment.  The hysterotomy was entered bluntly using the operators finger.  The hysterotomy incision was extended using manual traction.  The operators hand was placed within the hysterotomy position noting the fetus to be within the OA position.  The vertex was grasped, flexed, brought to the incision, and delivered a traumatically using fundal pressure.  The remainder of the body delivered with ease.  The infant was suctioned, cord was clamped and cut before handing off to the awaiting neonatologist.  The placenta was delivered using manual extraction.  The uterus was exteriorized, wiped clean of clots and debris using two moist laps.  The hysterotomy was closed using a two layer closure of 0 Vicryl, with the first being a running locked, the second a vertical imbricating.  The uterus was returned to the abdomen.  The peritoneal gutters were wiped clean of clots and debris using two moist laps.  The hysterotomy incision was re-inspected noted to be hemostatic.  The rectus muscles were inspected noted to be hemostatic.  The superior border of the rectus fascia was grasped with a Kocher clamp.  The ON-Q trocars were then placed 4cm above the superior border of the incision and tunneled subfascially.  The introducers were removed and the catheters were threaded through the sleeves after which the sleeves were removed.  The fascia was closed using a looped #1 PDS in a running fashion taking 1cm by 1cm bites.  The  subcutaneous tissue was irrigated using warm saline, hemostasis achieved using the bovie.  The subcutaneous dead space was less than 3cm and was not closed.  The skin was closed using Insorb staples.  Sponge needle and instrument counts were corrects times two.  The patient tolerated the procedure well and was taken to the recovery room in  stable condition.

## 2018-02-13 NOTE — Progress Notes (Addendum)
Dr Maisie Fushomas, anesthesia, Angelique Blonderenise CRNA, main OR staff, nursery staff and supervisor called and notified of plan for primary c/s Dr Bonney AidStaebler called urgent, for fetal intolerance to labor, failure to progress. All pertinent staff notified.

## 2018-02-13 NOTE — H&P (Signed)
   Subjective:  Comfortable epidural in place  Objective:   Vitals: Blood pressure 117/66, pulse 77, temperature 98.2 F (36.8 C), temperature source Oral, resp. rate 16, height 5\' 4"  (1.626 m), weight 195 lb (88.5 kg), last menstrual period 05/04/2017, SpO2 99 %, unknown if currently breastfeeding. General: NAD Abdomen: soft, non-tender Cervical Exam:  Dilation: 4 Effacement (%): 70 Cervical Position: Posterior Station: -3, -2 Presentation: Vertex Exam by:: D. Corse RN  FHT: 140, moderate, late deceleration, +accels Toco: irregular  Results for orders placed or performed during the hospital encounter of 02/12/18 (from the past 24 hour(s))  Type and screen [facility]     Status: None   Collection Time: 02/12/18 12:31 PM  Result Value Ref Range   ABO/RH(D) O POS    Antibody Screen NEG    Sample Expiration      02/15/2018 Performed at [facility], [address] [place], [place] [address]   CBC     Status: Abnormal   Collection Time: 02/12/18 12:48 PM  Result Value Ref Range   WBC 10.7 3.6 - 11.0 K/uL   RBC 3.65 (L) 3.80 - 5.20 MIL/uL   Hemoglobin 11.5 (L) 12.0 - 16.0 g/dL   HCT 60.433.4 (L) 54.035.0 - 98.147.0 %   MCV 91.6 80.0 - 100.0 fL   MCH 31.5 26.0 - 34.0 pg   MCHC 34.4 32.0 - 36.0 g/dL   RDW 19.114.0 47.811.5 - 29.514.5 %   Platelets 163 150 - 440 K/uL  RPR     Status: None   Collection Time: 02/12/18 12:48 PM  Result Value Ref Range   RPR Ser Ql Non Reactive Non Reactive    Assessment:   27 y.o. G3P0020 456w5d IOL for oligohydramnios with fetal intolerance to labor  Plan:   1) Labor - The patient was counseled regarding risk and benefits to proceeding with Cesarean section to expedite delivery.  Risk of cesarean section were discussed including risk of bleeding and need for potential intraoperative or postoperative blood transfusion with a rate of approximately 5% quoted for all Cesarean sections, risk of injury to adjacent organs including but  not limited to bowl and bladder, the need for additional surgical procedures to address such injuries, and the risk of infection.  The risk of continued attempts at vaginal delivery include but are note limited to worsening fetal or maternal status.  After consideration of options the patient is amenable to proceed with primary cesarean section for delivery.   2) Fetus - category II tracing, having intermittent late decelerations preventing further augmentation.  Vena AustriaAndreas July Nickson, MD, Evern CoreFACOG [facility], [facility] 02/13/2018, 5:07 AM

## 2018-02-13 NOTE — Transfer of Care (Signed)
Immediate Anesthesia Transfer of Care Note  Patient: Colleen Peterson  Procedure(s) Performed: CESAREAN SECTION (N/A Abdomen)  Patient Location: PACU  Anesthesia Type:Epidural  Level of Consciousness: awake, alert  and oriented  Airway & Oxygen Therapy: Patient Spontanous Breathing  Post-op Assessment: Report given to RN and Post -op Vital signs reviewed and stable  Post vital signs: Reviewed and stable  Last Vitals:  Vitals Value Taken Time  BP    Temp    Pulse    Resp    SpO2      Last Pain:  Vitals:   02/13/18 0359  TempSrc: Oral  PainSc: Asleep         Complications: No apparent anesthesia complications

## 2018-02-13 NOTE — Anesthesia Procedure Notes (Signed)
Epidural Patient location during procedure: OB  Staffing Anesthesiologist: Kreig Parson, MD Performed: anesthesiologist   Preanesthetic Checklist Completed: patient identified, site marked, surgical consent, pre-op evaluation, timeout performed, IV checked, risks and benefits discussed and monitors and equipment checked  Epidural Patient position: sitting Prep: ChloraPrep Patient monitoring: heart rate, continuous pulse ox and blood pressure Approach: midline Location: L4-L5 Injection technique: LOR saline  Needle:  Needle type: Tuohy  Needle gauge: 18 G Needle length: 9 cm and 9 Catheter type: closed end flexible Catheter size: 20 Guage Test dose: negative and 1.5% lidocaine with Epi 1:200 K  Assessment Sensory level: T10 Events: blood not aspirated, injection not painful, no injection resistance, negative IV test and no paresthesia  Additional Notes   Patient tolerated the insertion well without complications.Reason for block:procedure for pain     

## 2018-02-14 LAB — CBC
HCT: 26.1 % — ABNORMAL LOW (ref 35.0–47.0)
Hemoglobin: 9 g/dL — ABNORMAL LOW (ref 12.0–16.0)
MCH: 31.7 pg (ref 26.0–34.0)
MCHC: 34.5 g/dL (ref 32.0–36.0)
MCV: 92 fL (ref 80.0–100.0)
PLATELETS: 132 10*3/uL — AB (ref 150–440)
RBC: 2.84 MIL/uL — ABNORMAL LOW (ref 3.80–5.20)
RDW: 13.9 % (ref 11.5–14.5)
WBC: 9.2 10*3/uL (ref 3.6–11.0)

## 2018-02-14 MED ORDER — ONDANSETRON HCL 4 MG/2ML IJ SOLN
4.0000 mg | Freq: Four times a day (QID) | INTRAMUSCULAR | Status: DC | PRN
  Administered 2018-02-14: 4 mg via INTRAVENOUS
  Filled 2018-02-14: qty 2

## 2018-02-14 MED ORDER — FERROUS SULFATE 325 (65 FE) MG PO TABS
325.0000 mg | ORAL_TABLET | Freq: Two times a day (BID) | ORAL | Status: DC
  Administered 2018-02-14 – 2018-02-15 (×2): 325 mg via ORAL
  Filled 2018-02-14 (×3): qty 1

## 2018-02-14 MED ORDER — IBUPROFEN 600 MG PO TABS
600.0000 mg | ORAL_TABLET | Freq: Four times a day (QID) | ORAL | Status: DC | PRN
  Administered 2018-02-14 – 2018-02-15 (×3): 600 mg via ORAL
  Filled 2018-02-14 (×4): qty 1

## 2018-02-14 NOTE — Plan of Care (Signed)
  Problem: Education: Goal: Knowledge of condition will improve Outcome: Progressing   Problem: Activity: Goal: Will verbalize the importance of balancing activity with adequate rest periods Outcome: Progressing Goal: Ability to tolerate increased activity will improve Outcome: Progressing   Problem: Coping: Goal: Ability to identify and utilize available resources and services will improve Outcome: Progressing   Problem: Life Cycle: Goal: Chance of risk for complications during the postpartum period will decrease Outcome: Progressing   Problem: Role Relationship: Goal: Ability to demonstrate positive interaction with newborn will improve Outcome: Progressing   Problem: Skin Integrity: Goal: Demonstration of wound healing without infection will improve Outcome: Progressing   

## 2018-02-14 NOTE — Progress Notes (Addendum)
POD#1 pLTCS Subjective:  Resting on her side in bed, appears comfortable. States that pain control is good with On-Q and PRN medication. Voiding and ambulating without difficulty. Tolerating a regular diet.  Objective:  Blood pressure 101/68, pulse 67, temperature 98.2 F (36.8 C), resp. rate 18, height 5\' 4"  (1.626 m), weight 195 lb (88.5 kg), last menstrual period 05/04/2017, SpO2 98 %, currently breastfeeding.  General: NAD Pulmonary: no increased work of breathing Abdomen: non-distended, non-tender, fundus firm at level of umbilicus, lochia rubra small Incision: Dressing D/I/old drainage present, On-Q intact Extremities: no edema, no erythema, no tenderness  Results for orders placed or performed during the hospital encounter of 02/12/18 (from the past 72 hour(s))  Type and screen [facility]     Status: None   Collection Time: 02/12/18 12:31 PM  Result Value Ref Range   ABO/RH(D) O POS    Antibody Screen NEG    Sample Expiration      02/15/2018 Performed at [facility], [address] [place], [place] [address]   CBC     Status: Abnormal   Collection Time: 02/12/18 12:48 PM  Result Value Ref Range   WBC 10.7 3.6 - 11.0 K/uL   RBC 3.65 (L) 3.80 - 5.20 MIL/uL   Hemoglobin 11.5 (L) 12.0 - 16.0 g/dL   HCT 60.433.4 (L) 54.035.0 - 98.147.0 %   MCV 91.6 80.0 - 100.0 fL   MCH 31.5 26.0 - 34.0 pg   MCHC 34.4 32.0 - 36.0 g/dL   RDW 19.114.0 47.811.5 - 29.514.5 %   Platelets 163 150 - 440 K/uL    Comment: Performed at [facility], [address] [place], [place] [address]  RPR     Status: None   Collection Time: 02/12/18 12:48 PM  Result Value Ref Range   RPR Ser Ql Non Reactive Non Reactive    Comment: (NOTE) Performed At: [facility] [address] [place], [place] [id number] Jolene SchimkeNagendra Sanjai MD [phone] Performed at [facility], [address] [place], [place] [address]   CBC     Status: Abnormal   Collection Time: 02/14/18   6:16 AM  Result Value Ref Range   WBC 9.2 3.6 - 11.0 K/uL   RBC 2.84 (L) 3.80 - 5.20 MIL/uL   Hemoglobin 9.0 (L) 12.0 - 16.0 g/dL   HCT 53.626.1 (L) 64.435.0 - 03.447.0 %   MCV 92.0 80.0 - 100.0 fL   MCH 31.7 26.0 - 34.0 pg   MCHC 34.5 32.0 - 36.0 g/dL   RDW 74.213.9 59.511.5 - 63.814.5 %   Platelets 132 (L) 150 - 440 K/uL    Comment: Performed at [facility], [address] [place], [place] [address]     Assessment:   27 y.o. [id number] post-operative day #1 recovering well.  Plan:  1) Acute blood loss anemia - hemodynamically stable and asymptomatic - PO ferrous sulfate  2) Blood Type --/--/O POS (06/12 1231) / Rubella 2.19 (10/02 1512) / Varicella Immune  3) TDAP status: given 12/03/2017  4) Breastfeeding  5) Contraception: undecided  6) Disposition: continue postpartum care, anticipate discharge on POD#2-3.  Marcelyn BruinsJacelyn Schmid, CNM 02/14/2018

## 2018-02-14 NOTE — Anesthesia Postprocedure Evaluation (Signed)
Anesthesia Post Note  Patient: Amparo BristolSherrica M Direnzo  Procedure(s) Performed: CESAREAN SECTION (N/A Abdomen)  Patient location during evaluation: Mother Baby Anesthesia Type: Epidural Level of consciousness: awake and alert and oriented Pain management: satisfactory to patient Vital Signs Assessment: post-procedure vital signs reviewed and stable Respiratory status: respiratory function stable Cardiovascular status: stable Postop Assessment: no headache, no backache, epidural receding, patient able to bend at knees, no apparent nausea or vomiting, adequate PO intake and able to ambulate Anesthetic complications: no     Last Vitals:  Vitals:   02/14/18 0029 02/14/18 0342  BP: 113/67 111/76  Pulse: 72 70  Resp: 20 18  Temp: 36.8 C 36.7 C  SpO2:      Last Pain:  Vitals:   02/14/18 0641  TempSrc:   PainSc: 5                  Clydene PughBeane, Wallis Vancott D

## 2018-02-15 MED ORDER — OXYCODONE-ACETAMINOPHEN 5-325 MG PO TABS: 1.0000 | ORAL_TABLET | Freq: Four times a day (QID) | ORAL | 0 refills | Status: AC | PRN

## 2018-02-15 MED ORDER — IBUPROFEN 600 MG PO TABS: 600.0000 mg | ORAL_TABLET | Freq: Four times a day (QID) | ORAL | 0 refills | Status: DC | PRN

## 2018-02-15 NOTE — Progress Notes (Signed)
Pt states that she received TDaP vaccine during prenatal care.  Pt declines vaccine at this time. Reynold BowenSusan Paisley Dejanae Helser, RN 02/15/2018 11:12 AM

## 2018-02-15 NOTE — Progress Notes (Signed)
Discharge instructions provided.  Removal of On-Que pump and incision hygiene kit instructions provided.  Pt and sig other verbalize understanding of all instructions and follow-up care.   Prescriptions and incision hygiene kit given.  Pt discharged to home with infant at 1400 on 02/15/18 via wheelchair by volunteer. Reed Breech, RN 02/15/2018 5:54 PM

## 2018-02-15 NOTE — Discharge Summary (Signed)
OB Discharge Summary     Patient Name: Colleen Peterson DOB: [birth year] MRN: [id number]  Date of admission: 02/12/2018 Delivering MD: Colleen Peterson  Date of Delivery: 02/13/2018  Date of discharge: 02/15/2018  Admitting diagnosis: Induction 40 wks preg Intrauterine pregnancy: [redacted]w[redacted]d     Secondary diagnosis: oligohydramnios     Discharge diagnosis: Term Pregnancy Delivered                                                                                                Post partum procedures: none  Augmentation: Cytotec and Foley Balloon  Complications: None  Hospital course:  Induction of Labor With Cesarean Section  27 y.o. yo [id number] at [redacted]w[redacted]d was admitted to the hospital 02/12/2018 for induction of labor. Patient had a labor course significant for late decelerations. The patient went for cesarean section due to Non-Reassuring FHR, and delivered a Viable infant, 02/13/2018.  Details of operation can be found in separate operative Note.  Patient had an uncomplicated postpartum course. She is ambulating, tolerating a regular diet, passing flatus, and urinating well.  Patient is discharged home in stable condition on 02/15/18.                                    Physical exam  Vitals:   02/14/18 1920 02/15/18 0024 02/15/18 0100 02/15/18 0813  BP: 113/71 126/77  126/81  Pulse: 75 73  71  Resp: 18 20  18   Temp: 98.2 F (36.8 C) (!) 97.5 F (36.4 C) 98 F (36.7 C) 98 F (36.7 C)  TempSrc: Oral Oral Oral Oral  SpO2: 99% 99%  98%  Weight:      Height:       General: alert, cooperative and no distress Lochia: appropriate Uterine Fundus: firm Incision: Healing well with no significant drainage DVT Evaluation: No evidence of DVT seen on physical exam.  Labs: Lab Results  Component Value Date   WBC 9.2 02/14/2018   HGB 9.0 (L) 02/14/2018   HCT 26.1 (L) 02/14/2018   MCV 92.0 02/14/2018   PLT 132 (L) 02/14/2018    Discharge instruction: per After Visit  Summary.  Medications:  Allergies as of 02/15/2018   No Known Allergies     Medication List    STOP taking these medications   ranitidine 75 MG tablet Commonly known as:  ZANTAC 75     TAKE these medications   CITRANATAL HARMONY 27-1-260 MG Caps Take 1 capsule by mouth daily.   ibuprofen 600 MG tablet Commonly known as:  ADVIL,MOTRIN Take 1 tablet (600 mg total) by mouth every 6 (six) hours as needed for mild pain or moderate pain.   oxyCODONE-acetaminophen 5-325 MG tablet Commonly known as:  PERCOCET/ROXICET Take 1 tablet by mouth every 6 (six) hours as needed for up to 5 days for severe pain (pain scale 4-7).            Discharge Care Instructions  (From admission, onward)        Start     Ordered  02/15/18 0000  Discharge wound care:    Comments:  Keep incision dry, clean.   02/15/18 1015      Diet: routine diet  Activity: Advance as tolerated. Pelvic rest for 6 weeks.   Outpatient follow up: Follow-up Information    Colleen AustriaStaebler, Andreas, MD. Schedule an appointment as soon as possible for a visit in 5 day(s).   Specialty:  Obstetrics and Gynecology Why:  post op incision check Contact information: [address] [place] [address] [phone]             Postpartum contraception: Nexplanon Rhogam Given postpartum: NA Rubella vaccine given postpartum: Rubella Immune Varicella vaccine given postpartum: Varicella Immune TDaP given antepartum or postpartum: given antepartum  Newborn Data: Live born female  Birth Weight: 7 lb 2.6 oz (3250 g) APGAR: 9, 9  Newborn Delivery   Birth date/time:  02/13/2018 06:48:00 Delivery type:  C-Section, Low Transverse C-section categorization:  Primary      Baby Feeding: Breast  Disposition:home with mother  SIGNED:  Tresea MallJane Shiva Peterson, CNM 02/15/2018 10:16 AM

## 2018-02-15 NOTE — Discharge Instructions (Signed)
No strenuous activity or heavy lifting for 6 weeks.  No intercourse, tampons, douching, or enemas for 6 weeks.  No tub baths- showers only.  No driving for 2 weeks or while taking pain medications.  Call your doctor for increased pain or vaginal bleeding, temperature above 100.4, depression, or concerns.  Increase calories and fluids while breastfeeding.  Keep incision clean and dry.  Follow instructions in incision hygiene kit.

## 2018-02-19 ENCOUNTER — Ambulatory Visit (INDEPENDENT_AMBULATORY_CARE_PROVIDER_SITE_OTHER): Payer: Medicaid Other | Admitting: Obstetrics and Gynecology

## 2018-02-19 ENCOUNTER — Encounter: Payer: Self-pay | Admitting: Obstetrics and Gynecology

## 2018-02-19 ENCOUNTER — Telehealth: Payer: Self-pay | Admitting: Obstetrics and Gynecology

## 2018-02-19 VITALS — BP 132/72 | HR 70 | Wt 179.0 lb

## 2018-02-19 DIAGNOSIS — Z4889 Encounter for other specified surgical aftercare: Secondary | ICD-10-CM

## 2018-02-19 NOTE — Telephone Encounter (Signed)
Patient scheduled for nexplanon at her 6 wk PP with AMS on 7/24

## 2018-02-19 NOTE — Progress Notes (Signed)
      Postoperative Follow-up Patient presents post op from cesarean section 1weeks ago for fetal intolerance to labor.  Subjective: Patient reports marked improvement in her preop symptoms. Eating a regular diet without difficulty. Pain well controlled on ibuprofen only currently.  Activity: normal activities of daily living.  Objective: Blood pressure 132/72, pulse 70, weight 179 lb (81.2 kg), currently breastfeeding.  Gen: NAD  HEENT: normocephalic, anicteric Pulmonary: no increased work of breathing Abdomen: soft, non-tender, non-distended, ON-Q removed without difficulty, black tip noted, incision D/C/I.  Some skin breakdown and irritation at the edges of ON-Q dressing.  Admission on 02/12/2018, Discharged on 02/15/2018  Component Date Value Ref Range Status  . WBC 02/12/2018 10.7  3.6 - 11.0 K/uL Final  . RBC 02/12/2018 3.65* 3.80 - 5.20 MIL/uL Final  . Hemoglobin 02/12/2018 11.5* 12.0 - 16.0 g/dL Final  . HCT 16/10/960406/08/2018 33.4* 35.0 - 47.0 % Final  . MCV 02/12/2018 91.6  80.0 - 100.0 fL Final  . MCH 02/12/2018 31.5  26.0 - 34.0 pg Final  . MCHC 02/12/2018 34.4  32.0 - 36.0 g/dL Final  . RDW 54/09/811906/08/2018 14.0  11.5 - 14.5 % Final  . Platelets 02/12/2018 163  150 - 440 K/uL Final   Performed at [facility], [address] [place], [place] [address]  . ABO/RH(D) 02/12/2018 O POS   Final  . Antibody Screen 02/12/2018 NEG   Final  . Sample Expiration 02/12/2018    Final                   Value:02/15/2018 Performed at [facility], [address] [place], [place] [address]   . RPR Ser Ql 02/12/2018 Non Reactive  Non Reactive Final   Comment: (NOTE) Performed At: [facility] [address] [place], [place] [id number] Jolene SchimkeNagendra Sanjai MD NG:[phone] Performed at [facility], [address] [place], [place] [address]   . WBC 02/14/2018 9.2  3.6 - 11.0 K/uL Final  . RBC 02/14/2018 2.84* 3.80 - 5.20 MIL/uL Final  . Hemoglobin  02/14/2018 9.0* 12.0 - 16.0 g/dL Final  . HCT 72/53/664406/14/2019 26.1* 35.0 - 47.0 % Final  . MCV 02/14/2018 92.0  80.0 - 100.0 fL Final  . MCH 02/14/2018 31.7  26.0 - 34.0 pg Final  . MCHC 02/14/2018 34.5  32.0 - 36.0 g/dL Final  . RDW 03/47/425906/14/2019 13.9  11.5 - 14.5 % Final  . Platelets 02/14/2018 132* 150 - 440 K/uL Final   Performed at [facility], [address] [place], [place] [address]    Assessment: 27 y.o. s/p cesarean section stable  Plan: Patient has done well after surgery with no apparent complications.  I have discussed the post-operative course to date, and the expected progress moving forward.  The patient understands what complications to be concerned about.  I will see the patient in routine follow up, or sooner if needed.    Activity plan: No heavy lifting.  Interested in Nexplanon  Return in about 5 weeks (around 03/26/2018) for 6 week postpartum and nexplanon insertion.   Vena AustriaAndreas Afnan Emberton, MD, Evern CoreFACOG [facility], [facility] 02/19/2018, 2:59 PM

## 2018-02-25 ENCOUNTER — Other Ambulatory Visit: Payer: Self-pay | Admitting: Obstetrics and Gynecology

## 2018-02-25 ENCOUNTER — Telehealth: Payer: Self-pay

## 2018-02-25 ENCOUNTER — Encounter (INDEPENDENT_AMBULATORY_CARE_PROVIDER_SITE_OTHER): Payer: Self-pay

## 2018-02-25 MED ORDER — OXYCODONE-ACETAMINOPHEN 5-325 MG PO TABS: 1.0000 | ORAL_TABLET | ORAL | 0 refills | Status: DC | PRN

## 2018-02-25 NOTE — Progress Notes (Signed)
Refill percocet postop

## 2018-02-25 NOTE — Telephone Encounter (Signed)
Noted. Will order to arrive by apt date/time. 

## 2018-02-25 NOTE — Telephone Encounter (Signed)
Postpartum visit scheduled 7/24. Please advise

## 2018-02-25 NOTE — Telephone Encounter (Signed)
Patient is following up on prescription please advise

## 2018-02-25 NOTE — Telephone Encounter (Signed)
AMS told pt if any kind of pain to call and he would send rx to [facility].  [phone]

## 2018-03-17 ENCOUNTER — Other Ambulatory Visit: Payer: Self-pay

## 2018-03-17 ENCOUNTER — Encounter: Payer: Self-pay | Admitting: Obstetrics and Gynecology

## 2018-03-17 DIAGNOSIS — Z3493 Encounter for supervision of normal pregnancy, unspecified, third trimester: Secondary | ICD-10-CM

## 2018-03-17 MED ORDER — CITRANATAL HARMONY 27-1-260 MG PO CAPS: 1.0000 | ORAL_CAPSULE | Freq: Every day | ORAL | 4 refills | Status: DC

## 2018-03-21 ENCOUNTER — Encounter: Payer: Self-pay | Admitting: Advanced Practice Midwife

## 2018-03-21 ENCOUNTER — Ambulatory Visit (INDEPENDENT_AMBULATORY_CARE_PROVIDER_SITE_OTHER): Payer: BLUE CROSS/BLUE SHIELD | Admitting: Advanced Practice Midwife

## 2018-03-21 ENCOUNTER — Other Ambulatory Visit: Payer: Self-pay

## 2018-03-21 VITALS — BP 98/54 | HR 80 | Ht 64.0 in | Wt 171.0 lb

## 2018-03-21 DIAGNOSIS — N898 Other specified noninflammatory disorders of vagina: Secondary | ICD-10-CM | POA: Diagnosis not present

## 2018-03-21 NOTE — Progress Notes (Signed)
Patient ID: Colleen Peterson, female   DOB: 06-Mar-1991, 27 y.o.   MRN: [id number]  Reason for Consult: Postpartum Care (odorous d/c)     Subjective:     HPI:  Colleen Peterson is a 27 y.o. female G3 43P1021 female 5 weeks postpartum with complaint of vaginal discharge since she has been postpartum. Her bleeding stopped at about 2 weeks postpartum but she has had ongoing white to brown vaginal discharge since then. She describes the amount as occasionally a spot on her underwear and more frequently she sees it when she is wiping after urinating. She describes the smell as like a period. She has not had a period. She denies any itching, irritation or burning. She has had intercourse 5 days ago and used a condom for protection. Her 6 week postpartum visit is next week and she plans Nexplanon at that time.   Past Medical History:  Diagnosis Date  . BV (bacterial vaginosis)   . Trichomoniasis 05/2017   Family History  Problem Relation Age of Onset  . Breast cancer Maternal Grandmother    Past Surgical History:  Procedure Laterality Date  . CESAREAN SECTION N/A 02/13/2018   Procedure: CESAREAN SECTION;  Surgeon: Vena AustriaStaebler, Andreas, MD;  Location: [facility] ORS;  Service: Obstetrics;  Laterality: N/A;  . NO PAST SURGERIES      Short Social History:  Social History   Tobacco Use  . Smoking status: Never Smoker  . Smokeless tobacco: Never Used  Substance Use Topics  . Alcohol use: Yes    Comment: Socially     No Known Allergies  Current Outpatient Medications  Medication Sig Dispense Refill  . ibuprofen (ADVIL,MOTRIN) 600 MG tablet Take 1 tablet (600 mg total) by mouth every 6 (six) hours as needed for mild pain or moderate pain. 30 tablet 0  . oxyCODONE-acetaminophen (PERCOCET/ROXICET) 5-325 MG tablet Take 1 tablet by mouth every 4 (four) hours as needed. 30 tablet 0  . Prenat-FeFmCb-DSS-FA-DHA w/o A (CITRANATAL HARMONY) 27-1-260 MG CAPS Take 1 capsule by mouth daily. 30 capsule 4    No current facility-administered medications for this visit.     Review of Systems  Constitutional:  Constitutional negative. HENT: HENT negative.  Eyes: Eyes negative.  Respiratory: Respiratory negative.  Cardiovascular: Cardiovascular negative.  GI: Gastrointestinal negative.  GU:       Vaginal discharge with odor Musculoskeletal: Musculoskeletal negative.  Skin: Skin negative.  Neurological: Neurological negative. Hematologic: Hematologic/lymphatic negative.  Psychiatric: Psychiatric negative.        Objective:  Objective   Vitals:   03/21/18 1103  BP: (!) 98/54  Pulse: 80  Weight: 171 lb (77.6 kg)  Height: 5\' 4"  (1.626 m)   Body mass index is 29.35 kg/m.  Vital Signs: BP (!) 98/54   Pulse 80   Ht 5\' 4"  (1.626 m)   Wt 171 lb (77.6 kg)   LMP 02/13/2018   Breastfeeding? Yes   BMI 29.35 kg/m  Constitutional: Well nourished, well developed female in no acute distress.  HEENT: normal Skin: Warm and dry.  Respiratory:  Normal respiratory effort Psych: Alert and Oriented x3. No memory deficits. Normal mood and affect.    Pelvic exam:  is not limited by body habitus EGBUS: within normal limits Vagina: within normal limits and with normal mucosa, scant thin white discharge, no bleeding Cervix: not evaluated   Data: Wet prep: negative for clue cells, yeast or whiff     Assessment/Plan:     27 yo G3 P1021  female 5 weeks postpartum with normal vaginal discharge  Return to clinic in 1 week for 6 week postpartum follow up visit  Tresea Mall CNM [facility], [facility]

## 2018-03-26 ENCOUNTER — Ambulatory Visit: Payer: Medicaid Other | Admitting: Obstetrics and Gynecology

## 2018-04-04 ENCOUNTER — Ambulatory Visit (INDEPENDENT_AMBULATORY_CARE_PROVIDER_SITE_OTHER): Payer: Medicaid Other | Admitting: Obstetrics and Gynecology

## 2018-04-04 ENCOUNTER — Encounter: Payer: Self-pay | Admitting: Obstetrics and Gynecology

## 2018-04-04 VITALS — BP 104/60

## 2018-04-04 DIAGNOSIS — Z30017 Encounter for initial prescription of implantable subdermal contraceptive: Secondary | ICD-10-CM

## 2018-04-04 DIAGNOSIS — Z3049 Encounter for surveillance of other contraceptives: Secondary | ICD-10-CM

## 2018-04-04 NOTE — Progress Notes (Signed)
Postpartum Visit  Chief Complaint: No chief complaint on file.   History of Present Illness: Patient is a 27 y.o. Colleen Peterson presents for postpartum visit.  Date of delivery: 02/13/2018 Cesarean Section: Fetal intolerance to labor Pregnancy or labor problems:  Yes, oligohydramnios Any problems since the delivery:  no  Newborn Details:  SINGLETON :  1. BabyGender female. Birth weight: 7lbs 3oz Maternal Details:  Breast or formula feeding: plans to breastfeed Intercourse: No  Contraception after delivery: No  Any bowel or bladder issues: No  Post partum depression/anxiety noted:  no Date of last PAP: 06/04/2017 no abnormalities   Review of Systems: Review of Systems  Constitutional: Negative.   Gastrointestinal: Negative.   Psychiatric/Behavioral: Negative.     The following portions of the patient's history were reviewed and updated as appropriate: allergies, current medications, past family history, past medical history, past social history, past surgical history and problem list.  Past Medical History:  Past Medical History:  Diagnosis Date  . BV (bacterial vaginosis)   . Trichomoniasis 05/2017    Past Surgical History:  Past Surgical History:  Procedure Laterality Date  . CESAREAN SECTION N/A 02/13/2018   Procedure: CESAREAN SECTION;  Surgeon: Vena Austria, MD;  Location: [facility] ORS;  Service: Obstetrics;  Laterality: N/A;  . NO PAST SURGERIES      Family History:  Family History  Problem Relation Age of Onset  . Breast cancer Maternal Grandmother     Social History:  Social History   Socioeconomic History  . Marital status: Single    Spouse name: Not on file  . Number of children: 0  . Years of education: Not on file  . Highest education level: Not on file  Occupational History  . Not on file  Social Needs  . Financial resource strain: Not on file  . Food insecurity:    Worry: Not on file    Inability: Not on file  . Transportation needs:      Medical: Not on file    Non-medical: Not on file  Tobacco Use  . Smoking status: Never Smoker  . Smokeless tobacco: Never Used  Substance and Sexual Activity  . Alcohol use: Yes    Comment: Socially   . Drug use: No  . Sexual activity: Yes    Birth control/protection: None  Lifestyle  . Physical activity:    Days per week: Not on file    Minutes per session: Not on file  . Stress: Not on file  Relationships  . Social connections:    Talks on phone: Not on file    Gets together: Not on file    Attends religious service: Not on file    Active member of club or organization: Not on file    Attends meetings of clubs or organizations: Not on file    Relationship status: Not on file  . Intimate partner violence:    Fear of current or ex partner: Not on file    Emotionally abused: Not on file    Physically abused: Not on file    Forced sexual activity: Not on file  Other Topics Concern  . Not on file  Social History Narrative  . Not on file    Allergies:  No Known Allergies  Medications: Prior to Admission medications   Medication Sig Start Date End Date Taking? Authorizing Provider  ibuprofen (ADVIL,MOTRIN) 600 MG tablet Take 1 tablet (600 mg total) by mouth every 6 (six) hours as needed for  mild pain or moderate pain. 02/15/18   Tresea MallGledhill, Jane, CNM  oxyCODONE-acetaminophen (PERCOCET/ROXICET) 5-325 MG tablet Take 1 tablet by mouth every 4 (four) hours as needed. 02/25/18   Vena AustriaStaebler, Ishaan Villamar, MD  Prenat-FeFmCb-DSS-FA-DHA w/o A (CITRANATAL HARMONY) 27-1-260 MG CAPS Take 1 capsule by mouth daily. 03/17/18   Vena AustriaStaebler, Cesily Cuoco, MD    Physical Exam Blood pressure 104/60, last menstrual period 03/24/2018, currently breastfeeding.   General: NAD HEENT: normocephalic, anicteric Pulmonary: No increased work of breathing Abdomen: Soft, non-tender, non-distended.  Umbilicus without lesions.  No hepatomegaly, splenomegaly or masses palpable. No evidence of hernia. Incision  D/C/I Genitourinary:  External: Normal external female genitalia.  Normal urethral meatus, normal  Bartholin's and Skene's glands.    Vagina: Normal vaginal mucosa, no evidence of prolapse.    Cervix: Grossly normal in appearance, no bleeding  Uterus: Non-enlarged, mobile, normal contour.  No CMT  Adnexa: ovaries non-enlarged, no adnexal masses  Rectal: deferred Extremities: no edema, erythema, or tenderness Neurologic: Grossly intact Psychiatric: mood appropriate, affect full   GYNECOLOGY PROCEDURE NOTE  Patient is a 27 y.o. [id number] presenting for Nexplanon insertion as her desires means of contraception.  She provided informed consent, signed copy in the chart, time out was performed. Pregnancy test was not obtained.  Negative predictive value of 99%-100% - < OR = 7 days from the onset of a normal menstrual cycle - has not had sexual intercourse since the start of the last menstrual cycle - has correctly and consistently used a reliable form of contraception - < or = 7 days from an induced abortion - is within 4 weeks postpartum - is exclusively breast feeding (>85% of feeds), amenorrheic, and <6 months postpartum   "US Selected Practice Recommendations for Contraceptive Use", April 01, 2015   She understands that Nexplanon is a progesterone only therapy, and that patients often patients have irregular and unpredictable vaginal bleeding or amenorrhea. She understands that other side effects are possible related to systemic progesterone, including but not limited to, headaches, breast tenderness, nausea, and irritability. While effective at preventing pregnancy long acting reversible contraceptives do not prevent transmission of sexually transmitted diseases and use of barrier methods for this purpose was discussed. The placement procedure for Nexplanon was reviewed with the patient in detail including risks of nerve injury, infection, bleeding and injury to other muscles or tendons. She  understands that the Nexplanon implant is good for 3 years and needs to be removed at the end of that time.  She understands that Nexplanon is an extremely effective option for contraception, with failure rate of <1%. This information is reviewed today and all questions were answered. Informed consent was obtained, both verbally and written.   The patient is healthy and has no contraindications to Implanon use. Urine pregnancy test was performed today and was negative.  Procedure Appropriate time out taken.  Patient placed in dorsal supine with left arm above head, elbow flexed at 90 degrees, arm resting on examination table.  The bicipital grove was palpated and site 8-10cm proximal to the medial epicondyle was indentified . The insertion site was prepped with a two betadine swabs and then injected with 3 cc of 1% lidocaine without epinephrine.  Nexplanon removed form sterile blister packaging,  Device confirmed in needle, before inserting full length of needle, tenting up the skin as the needle was advance.  The drug eluting rod was then deployed by pulling back the slider per the manufactures recommendation.  The implant was palpable by the clinician as  well as the patient.  The insertion site covered dressed with a band aid before applying  a kerlex bandage pressure dressing..Minimal blood loss was noted during the procedure.  The patientt tolerated the procedure well.   She was instructed to wear the bandage for 24 hours, call with any signs of infection.  She was given the Implanon card and instructed to have the rod removed in 3 years.  Assessment: 27 y.o. [id number] presenting for 6 week postpartum visit, Nexplanon insertion  Plan: Problem List Items Addressed This Visit    None    Visit Diagnoses    Nexplanon insertion    -  Primary   6 weeks postpartum follow-up          1) Contraception - Education given regarding options for contraception, as well as compatibility with breast feeding  if applicable.  Patient plans on Nexplanon for contraception.  2)  Pap - ASCCP guidelines and rational discussed.  ASCCP guidelines and rational discussed.  Patient opts for every 3 years screening interval - UTD 06/04/2017 NIL  3) Patient underwent screening for postpartum depression with no signs of depression  4) No follow-ups on file.   Vena Austria, MD, Evern Core [facility], [facility] 04/04/2018, 2:34 PM
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
## 2019-05-27 ENCOUNTER — Other Ambulatory Visit: Payer: Self-pay

## 2019-05-27 DIAGNOSIS — Z20822 Contact with and (suspected) exposure to covid-19: Secondary | ICD-10-CM

## 2019-05-29 LAB — NOVEL CORONAVIRUS, NAA: SARS-CoV-2, NAA: NOT DETECTED

## 2019-10-19 NOTE — Telephone Encounter (Signed)
Patient No Show 03/26/18 visit. Nexplanon Rcvd/Charged 04/04/18

## 2020-02-25 ENCOUNTER — Ambulatory Visit: Payer: BLUE CROSS/BLUE SHIELD | Attending: Internal Medicine

## 2020-02-25 DIAGNOSIS — Z20822 Contact with and (suspected) exposure to covid-19: Secondary | ICD-10-CM

## 2020-02-26 LAB — SARS-COV-2, NAA 2 DAY TAT

## 2020-02-26 LAB — NOVEL CORONAVIRUS, NAA: SARS-CoV-2, NAA: NOT DETECTED

## 2021-05-02 ENCOUNTER — Other Ambulatory Visit: Payer: Self-pay

## 2021-05-02 ENCOUNTER — Emergency Department
Admission: EM | Admit: 2021-05-02 | Discharge: 2021-05-02 | Disposition: A | Discharge status: Home / Self Care | Payer: Medicaid Other | Attending: Emergency Medicine | Admitting: Emergency Medicine

## 2021-05-02 DIAGNOSIS — S30860A Insect bite (nonvenomous) of lower back and pelvis, initial encounter: Secondary | ICD-10-CM | POA: Insufficient documentation

## 2021-05-02 DIAGNOSIS — W57XXXA Bitten or stung by nonvenomous insect and other nonvenomous arthropods, initial encounter: Secondary | ICD-10-CM | POA: Diagnosis not present

## 2021-05-02 NOTE — ED Notes (Signed)
See triage note  Presents with swelling to labia  Thinks she may have been stung by something

## 2021-05-02 NOTE — ED Provider Notes (Signed)
[facility] Emergency Department Provider Note  ____________________________________________   Event Date/Time   First MD Initiated Contact with Patient 05/02/21 1411     (approximate)  I have reviewed the triage vital signs and the nursing notes.   HISTORY  Chief Complaint Insect Bite    HPI Colleen Peterson is a 30 y.o. female presents emergency department complaining of a bug bite to her vagina.  Patient states that the bug bit her in her sleep.  States that the area was so swollen it was hanging down in between her legs.  No fever or chills.  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 Authorizing Provider  ibuprofen (ADVIL,MOTRIN) 600 MG tablet Take 1 tablet (600 mg total) by mouth every 6 (six) hours as needed for mild pain or moderate pain. 02/15/18   Tresea Mall, CNM    Allergies Patient has no known allergies.  Family History  Problem Relation Age of Onset   Breast cancer Maternal Grandmother     Social History Social History   Tobacco Use   Smoking status: Never   Smokeless tobacco: Never  Vaping Use   Vaping Use: Never used  Substance Use Topics   Alcohol use: Yes    Comment: Socially    Drug use:  No    Review of Systems  Constitutional: No fever/chills Eyes: No visual changes. ENT: No sore throat. Respiratory: Denies cough Cardiovascular: Denies chest pain Gastrointestinal: Denies abdominal pain Genitourinary: Negative for dysuria. Musculoskeletal: Negative for back pain. Skin: Negative for rash. Psychiatric: no mood changes,     ____________________________________________   PHYSICAL EXAM:  VITAL SIGNS: ED Triage Vitals  Enc Vitals Group     BP 05/02/21 1302 110/71     Pulse Rate 05/02/21 1302 73     Resp 05/02/21 1302 15     Temp 05/02/21 1302 98.5 F (36.9 C)     Temp Source 05/02/21 1302 Oral     SpO2 05/02/21 1302 96 %     Weight 05/02/21 1258 170 lb (77.1 kg)     Height 05/02/21 1258 5\' 4"  (1.626 m)     Head Circumference --      Peak Flow --      Pain Score 05/02/21 1258 3     Pain Loc --      Pain Edu? --      Excl. in GC? --     Constitutional: Alert and oriented. Well appearing and in no acute distress. Eyes: Conjunctivae are normal.  Head: Atraumatic. Nose: No congestion/rhinnorhea. Mouth/Throat: Mucous membranes are moist.   Neck:  supple no lymphadenopathy noted Cardiovascular: Normal rate, regular rhythm.  Respiratory: Normal respiratory effort.  No retractions,  GU: External exam  of the vaginal area does not show any redness or swelling, no drainage Musculoskeletal: FROM all extremities, warm and well perfused Neurologic:  Normal speech and language.  Skin:  Skin is warm, dry and intact. No rash noted. Psychiatric: Mood and affect are normal. Speech and behavior are normal.  ____________________________________________   LABS (all labs ordered are listed, but only abnormal results are displayed)  Labs Reviewed - No data to display ____________________________________________   ____________________________________________  RADIOLOGY    ____________________________________________   PROCEDURES  Procedure(s) performed:  No  Procedures    ____________________________________________   INITIAL IMPRESSION / ASSESSMENT AND PLAN / ED COURSE  Pertinent labs & imaging results that were available during my care of the patient were reviewed by me and considered in my medical decision making (see chart for details).   Patient is a 30 year old female presents emergency department with a bug bite to the vagina.  See HPI.  Physical exam shows the patient to appear stable.  No redness or swelling noted in the vaginal area  Registration went to the room after I left to register and have the patient's sign.  She is now stating I did nothing for her and will not sign. Went back into the room to explain to the patient that she was treated as she complained of a bug bite in the vagina and actually looked in her vagina.  She agrees to sign at this time.  He was discharged stable condition     Colleen Peterson was evaluated in Emergency Department on 05/02/2021 for the symptoms described in the history of present illness. She was evaluated in the context of the global COVID-19 pandemic, which necessitated consideration that the patient might be at risk for infection with the SARS-CoV-2 virus that causes COVID-19. Institutional protocols and algorithms that pertain to the evaluation of patients at risk for COVID-19 are in a state of rapid change based on information released by regulatory bodies including the CDC and federal and state organizations. These policies and algorithms were followed during the patient's care in the ED.    As part of my medical decision making, I reviewed the following data within the electronic MEDICAL RECORD NUMBER Nursing notes reviewed and incorporated, Old chart reviewed, Notes from prior ED visits, and [place] Controlled Substance Database  ____________________________________________   FINAL CLINICAL IMPRESSION(S) / ED DIAGNOSES  Final diagnoses:  Insect bite of pelvic region, initial encounter       NEW MEDICATIONS STARTED DURING THIS VISIT:  New Prescriptions   No medications on file     Note:  This document was prepared using Dragon voice recognition software and may include unintentional dictation errors.    Faythe Ghee, PA-C 05/02/21 1509    Georga Hacking, MD 05/02/21 [phone]

## 2021-05-02 NOTE — ED Triage Notes (Signed)
Pt states she thinks she was bit by something on her labia last night, daughter is here with similar insect bites. States the swelling has gone down since last night

## 2021-07-12 ENCOUNTER — Ambulatory Visit (INDEPENDENT_AMBULATORY_CARE_PROVIDER_SITE_OTHER): Admitting: Certified Nurse Midwife

## 2021-07-12 ENCOUNTER — Other Ambulatory Visit: Payer: Self-pay

## 2021-07-12 ENCOUNTER — Encounter: Payer: Self-pay | Admitting: Certified Nurse Midwife

## 2021-07-12 ENCOUNTER — Other Ambulatory Visit (HOSPITAL_COMMUNITY)
Admission: RE | Admit: 2021-07-12 | Discharge: 2021-07-12 | Disposition: A | Discharge status: Home / Self Care | Payer: Medicaid Other | Source: Ambulatory Visit | Attending: Certified Nurse Midwife | Admitting: Certified Nurse Midwife

## 2021-07-12 DIAGNOSIS — Z113 Encounter for screening for infections with a predominantly sexual mode of transmission: Secondary | ICD-10-CM | POA: Insufficient documentation

## 2021-07-12 DIAGNOSIS — Z01419 Encounter for gynecological examination (general) (routine) without abnormal findings: Secondary | ICD-10-CM | POA: Diagnosis present

## 2021-07-12 DIAGNOSIS — Z124 Encounter for screening for malignant neoplasm of cervix: Secondary | ICD-10-CM

## 2021-07-12 DIAGNOSIS — N898 Other specified noninflammatory disorders of vagina: Secondary | ICD-10-CM | POA: Diagnosis present

## 2021-07-12 DIAGNOSIS — Z13220 Encounter for screening for lipoid disorders: Secondary | ICD-10-CM | POA: Diagnosis not present

## 2021-07-12 DIAGNOSIS — L708 Other acne: Secondary | ICD-10-CM

## 2021-07-12 DIAGNOSIS — G43001 Migraine without aura, not intractable, with status migrainosus: Secondary | ICD-10-CM

## 2021-07-12 NOTE — Progress Notes (Signed)
Well

## 2021-07-12 NOTE — Progress Notes (Signed)
GYNECOLOGY ANNUAL PREVENTATIVE CARE ENCOUNTER NOTE  History:     Colleen Peterson is a 30 y.o. G66P1021 female here for a routine annual gynecologic exam.  Current complaints: none.   Denies abnormal vaginal bleeding, discharge, pelvic pain, problems with intercourse or other gynecologic concerns.     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   Gynecologic History No LMP recorded. Contraception: Nexplanon 2019 Last Pap: 06/04/2017. Results were: normal  Last mammogram: n/a.    Upstream - 07/12/21 1057       Pregnancy Intention Screening   Does the patient want to become pregnant in the next year? No    Does the patient's partner want to become pregnant in the next year? No    Would the patient like to discuss contraceptive options today? Yes            The pregnancy intention screening data noted above was reviewed. Potential methods of contraception were discussed. The patient elected to proceed with nexpalnon, due to come out wants to take a break .   Obstetric History OB History  Gravida Para Term Preterm AB Living  3 1 1   2 1   SAB IAB Ectopic Multiple Live Births  1     0 1    # Outcome Date GA Lbr Len/2nd Weight Sex Delivery Anes PTL Lv  3 Term 02/13/18 [redacted]w[redacted]d  7 lb 2.6 oz (3.25 kg) F CS-LTranv EPI  LIV  2 AB 09/2016          1 SAB 2011            Past Medical History:  Diagnosis Date   BV (bacterial vaginosis)    Trichomoniasis 05/2017  Migraine with aura  Acne   Past Surgical History:  Procedure Laterality Date   CESAREAN SECTION N/A 02/13/2018   Procedure: CESAREAN SECTION;  Surgeon: 02/15/2018, MD;  Location: [facility] ORS;  Service: Obstetrics;  Laterality: N/A;   NO PAST SURGERIES      Current Outpatient Medications on File Prior to Visit  Medication Sig Dispense Refill   etonogestrel (NEXPLANON) 68  MG IMPL implant 1 each by Subdermal route once.     No current facility-administered medications on file prior to visit.    No Known Allergies  Social History:  reports that she has never smoked. She has never used smokeless tobacco. She reports current alcohol use. She reports that she does not use drugs.  Family History  Problem Relation Age of Onset   Breast cancer Maternal Grandmother     The following portions of the patient's history were reviewed and updated as appropriate: allergies, current medications, past family history, past medical history, past social history, past surgical history and problem list.  Review of Systems Pertinent items noted in HPI and remainder of comprehensive ROS otherwise negative.  Physical Exam:  There were no vitals taken for this visit. CONSTITUTIONAL: Well-developed, well-nourished female in no acute distress.  HENT:  Normocephalic, atraumatic, External right and left ear normal. Oropharynx is clear and moist EYES: Conjunctivae and EOM are normal. Pupils are equal, round, and reactive to light. No scleral icterus.  NECK: Normal range of motion, supple, no masses.  Normal thyroid.  SKIN: Skin is warm and dry. No rash noted. Not diaphoretic. No erythema. No pallor. Facial acne that has worsened  MUSCULOSKELETAL: Normal range of motion. No tenderness.  No cyanosis, clubbing, or edema.  2+ distal pulses. NEUROLOGIC: Alert and oriented to person, place, and time. Normal reflexes, muscle tone coordination.  PSYCHIATRIC: Normal mood and affect. Normal behavior. Normal judgment and thought content. CARDIOVASCULAR: Normal heart rate noted, regular rhythm RESPIRATORY: Clear to auscultation bilaterally. Effort and breath sounds normal, no problems with respiration noted. BREASTS: Symmetric in size. No masses, tenderness, skin changes, nipple drainage, or lymphadenopathy bilaterally.  ABDOMEN: Soft, no distention noted.  No tenderness, rebound or guarding.   PELVIC: Normal appearing external genitalia and urethral meatus; normal appearing vaginal mucosa and cervix.  discharge noted- white , thick with odor.  Pap smear obtained. Contact bleeding   Normal uterine size, no other palpable masses, no uterine or adnexal tenderness.  .   Assessment and Plan:    1. Well woman exam with routine gynecological exam    Pap: Will follow up results of pap smear and manage accordingly. Mammogram : n/a  Labs: Hep C, Lipid, vaginal swab Refills: none Referral: nero & derm.  Routine preventative health maintenance measures emphasized. Please refer to After Visit Summary for other counseling recommendations.      Doreene Burke, CNM [facility],  [facility]

## 2021-07-13 LAB — CERVICOVAGINAL ANCILLARY ONLY
Bacterial Vaginitis (gardnerella): POSITIVE — AB
Candida Glabrata: NEGATIVE
Candida Vaginitis: NEGATIVE
Chlamydia: NEGATIVE
Comment: NEGATIVE
Comment: NEGATIVE
Comment: NEGATIVE
Comment: NEGATIVE
Comment: NEGATIVE
Comment: NORMAL
Neisseria Gonorrhea: NEGATIVE
Trichomonas: POSITIVE — AB

## 2021-07-13 LAB — LIPID PANEL
Chol/HDL Ratio: 4.3 ratio (ref 0.0–4.4)
Cholesterol, Total: 207 mg/dL — ABNORMAL HIGH (ref 100–199)
HDL: 48 mg/dL (ref >39)
LDL Chol Calc (NIH): 144 mg/dL — ABNORMAL HIGH (ref 0–99)
Triglycerides: 83 mg/dL (ref 0–149)
VLDL Cholesterol Cal: 15 mg/dL (ref 5–40)

## 2021-07-13 LAB — HEPATITIS C ANTIBODY: Hep C Virus Ab: <0.1 s/co ratio (ref 0.0–0.9)

## 2021-07-14 ENCOUNTER — Other Ambulatory Visit: Payer: Self-pay | Admitting: Certified Nurse Midwife

## 2021-07-14 MED ORDER — METRONIDAZOLE 500 MG PO TABS: 500.0000 mg | ORAL_TABLET | Freq: Two times a day (BID) | ORAL | 0 refills | Status: AC

## 2021-07-18 ENCOUNTER — Other Ambulatory Visit: Payer: Self-pay | Admitting: Certified Nurse Midwife

## 2021-07-18 LAB — CYTOLOGY - PAP
Comment: NEGATIVE
Diagnosis: NEGATIVE
High risk HPV: NEGATIVE

## 2021-09-18 ENCOUNTER — Ambulatory Visit (INDEPENDENT_AMBULATORY_CARE_PROVIDER_SITE_OTHER): Payer: Medicaid Other | Admitting: Certified Nurse Midwife

## 2021-09-18 ENCOUNTER — Other Ambulatory Visit: Payer: Self-pay

## 2021-09-18 VITALS — BP 105/71 | HR 97 | Ht 64.0 in | Wt 170.3 lb

## 2021-09-18 DIAGNOSIS — Z3046 Encounter for surveillance of implantable subdermal contraceptive: Secondary | ICD-10-CM

## 2021-09-18 DIAGNOSIS — G43811 Other migraine, intractable, with status migrainosus: Secondary | ICD-10-CM

## 2021-09-18 NOTE — Patient Instructions (Signed)
Nexplanon Instructions After care ° °Keep bandage clean and dry for 24 hours ° °May use ice/Tylenol/Ibuprofen for soreness or pain ° °If you develop fever, drainage or increased warmth from incision site-contact office immediately ° ° °

## 2021-09-18 NOTE — Progress Notes (Signed)
Colleen Peterson is a 31 y.o. year old G25P1021 African American female here for Nexplanon removal .  She was given informed consent for removal  of her Nexplanon. States she is getting lots of acne and feels like this maybe the reason.     BP 105/71    Pulse 97    Ht 5\' 4"  (1.626 m)    Wt 170 lb 4.8 oz (77.2 kg)    LMP 09/10/2021 (Approximate)    Breastfeeding Unknown    BMI 29.23 kg/m  Patient's last menstrual period was 09/10/2021 (approximate). No results found for this or any previous visit (from the past 24 hour(s)).   Appropriate time out taken. Nexplanon site identified.  Area prepped in usual sterile fashon. Two cc's of 2% lidocaine was used to anesthetize the area. A small stab incision was made right beside the implant on the distal portion.  The Nexplanon rod was grasped using hemostats and removed intact without difficulty.   Steri-strips and a pressure bandage was applied.  There was less than 3 cc blood loss. There were no complications.  The patient tolerated the procedure well.  She was instructed to keep the area clean and dry, remove pressure bandage in 24 hours, and keep insertion site covered with the steri-strips for 3-5 days. She will use condoms for birth control.   Follow-up PRN problems.  Philip Aspen, CNM

## 2022-01-18 ENCOUNTER — Ambulatory Visit: Payer: Self-pay | Admitting: Dermatology

## 2022-01-20 ENCOUNTER — Emergency Department
Admission: EM | Admit: 2022-01-20 | Discharge: 2022-01-20 | Disposition: A | Discharge status: Home / Self Care | Payer: Medicaid Other | Attending: Emergency Medicine | Admitting: Emergency Medicine

## 2022-01-20 ENCOUNTER — Encounter: Payer: Self-pay | Admitting: Emergency Medicine

## 2022-01-20 ENCOUNTER — Other Ambulatory Visit: Payer: Self-pay

## 2022-01-20 DIAGNOSIS — K029 Dental caries, unspecified: Secondary | ICD-10-CM | POA: Diagnosis not present

## 2022-01-20 DIAGNOSIS — K0889 Other specified disorders of teeth and supporting structures: Secondary | ICD-10-CM | POA: Diagnosis present

## 2022-01-20 MED ORDER — ACETAMINOPHEN 325 MG PO TABS
650.0000 mg | ORAL_TABLET | Freq: Once | ORAL | Status: AC
  Administered 2022-01-20: 650 mg via ORAL
  Filled 2022-01-20: qty 2

## 2022-01-20 MED ORDER — AMOXICILLIN-POT CLAVULANATE 875-125 MG PO TABS: 1.0000 | ORAL_TABLET | Freq: Two times a day (BID) | ORAL | 0 refills | Status: AC

## 2022-01-20 MED ORDER — CHLORHEXIDINE GLUCONATE 0.12 % MT SOLN: 15.0000 mL | Freq: Two times a day (BID) | OROMUCOSAL | 0 refills | Status: DC

## 2022-01-20 NOTE — Discharge Instructions (Signed)
Please follow-up with a dentist.  Take the medications as prescribed.  Please return for any new, worsening, or change in symptoms or other concerns.  It was a pleasure caring for you today.

## 2022-01-20 NOTE — ED Provider Notes (Signed)
[facility] Provider Note    Event Date/Time   First MD Initiated Contact with Patient 01/20/22 1213     (approximate)   History   Dental Pain   HPI  Colleen Peterson is a 31 y.o. female who presents today for evaluation of dental pain.  Patient reports that she has had this pain for several weeks, however she feels that it is gotten worse in the past couple of days.  She denies any facial or neck swelling.  She denies any difficulty opening her mouth or swallowing.  She has not had any fevers or chills.  She is supposed to get the tooth pulled but has had scheduling difficulties.  She reports that she has pain with eating but is able to eat soft foods. She reports that she plans on seeing dentist on Monday, in 2 days. Denies fever/chills.   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 --      Excl. in GC? --     Most recent vital signs: Vitals:   01/20/22 1207 01/20/22 1336  BP: (!) 125/96 128/90  Pulse: 71 80  Resp: 20 16  Temp: 99.5 F (37.5 C) 99 F (37.2 C)  SpO2: 99% 98%    Physical Exam Vitals and nursing note reviewed.  Constitutional:      General: Awake and alert. No acute distress.    Appearance: Normal appearance. She is well-developed and normal weight.  HENT:     Head: Normocephalic and atraumatic.     Mouth/Throat: Tooth 29 with obvious decay. No gingival fluctuance. Mild tap tenderness. No  tooth loosening. No sublingual swelling. No trismus. No facial or neck swelling/erythema. No voice change    Mouth: Mucous membranes are moist.  Eyes:     General: PERRL. Normal EOMs        Right eye: No discharge.        Left eye: No discharge.     Conjunctiva/sclera: Conjunctivae normal.  Cardiovascular:     Rate and Rhythm: Normal rate and regular rhythm.     Pulses: Normal pulses.     Heart sounds: Normal heart sounds Pulmonary:     Effort: Pulmonary effort is normal. No respiratory distress.     Breath sounds: Normal breath sounds.  Abdominal:     Abdomen is soft. There is no abdominal tenderness. No rebound or guarding. No distention. Musculoskeletal:        General: No swelling. Normal range of motion.     Cervical back: Normal range of motion and neck supple.  Lymphadenopathy:     Cervical: No cervical adenopathy.  Skin:    General: Skin is warm and dry.     Capillary Refill: Capillary refill takes less than 2 seconds.     Findings: No rash.  Neurological:     Mental Status: She is alert.      ED Results / Procedures / Treatments   Labs (all labs ordered are listed, but only abnormal results are displayed) Labs Reviewed - No data to display   EKG     RADIOLOGY     PROCEDURES:  Critical Care performed:   Procedures   MEDICATIONS ORDERED IN ED: Medications  acetaminophen (TYLENOL) tablet 650 mg (650 mg Oral Given 01/20/22 1227)     IMPRESSION / MDM / ASSESSMENT AND PLAN / ED COURSE  I reviewed the triage vital signs and the nursing notes.   Differential diagnosis includes, but is not limited to, dental caries, pulpitis, abscess, dental decay. Patient was evaluated in the emergency department for dental pain. She is awake and alert, hemodynamically stable and afebrile. Patient has tenderness to tooth 29 with obvious decay. No gingival swelling or fluctuance concerning for gingival abscess. No facial or neck swelling. No fever. No trismus, nuchal  rigidity, neck pain, hot potato voice, uvular deviation or malocclusion to suggest deep space infection. No sublingual swelling concerning for Ludwig's angina.  Patient was started on antibiotics and chlorhexidine mouth rinse.  Patient was treated symptomatically in the emergency department with improvement of her symptoms. Discussed care plan, return precautions, and advised close outpatient follow-up with dentist. Given list of low cost dental clinics. Patient agrees with plan of care.     FINAL CLINICAL IMPRESSION(S) / ED DIAGNOSES   Final diagnoses:  Dental caries  Pain, dental     Rx / DC Orders   ED Discharge Orders          Ordered    chlorhexidine (PERIDEX) 0.12 % solution  2 times daily        01/20/22 1326    amoxicillin-clavulanate (AUGMENTIN) 875-125 MG tablet  2 times daily        01/20/22 1326             Note:  This document was prepared using Dragon voice recognition software and may include unintentional dictation errors.   Jackelyn Hoehn, PA-C 01/20/22 1343    Merwyn Katos, MD 01/20/22 [phone]

## 2022-01-20 NOTE — ED Triage Notes (Signed)
Pt via POV from home. Pt c/o R lower dental pain since Thursday, pt has a tooth that needs to be pulled but states that she has not had the time to get it pulled. Denies fever. Pt is A&Ox4 and NAD

## 2022-01-20 NOTE — ED Notes (Signed)
See triage note. Pt reports has dentist but they "don't do extractions" and she is trying to get consult to someone who does; continues to deny fever & chills; reports pain, report nausea in last 2 days which has caused her not to be able to eat almost anything. Pt laying calmly on stretcher, resp reg/unlabored, skin dry.

## 2022-05-11 ENCOUNTER — Encounter: Payer: Self-pay | Admitting: Certified Nurse Midwife

## 2022-05-11 ENCOUNTER — Ambulatory Visit (INDEPENDENT_AMBULATORY_CARE_PROVIDER_SITE_OTHER): Admitting: Certified Nurse Midwife

## 2022-05-11 VITALS — BP 102/62 | HR 90 | Ht 64.0 in | Wt 169.0 lb

## 2022-05-11 DIAGNOSIS — Z30017 Encounter for initial prescription of implantable subdermal contraceptive: Secondary | ICD-10-CM | POA: Diagnosis not present

## 2022-05-11 DIAGNOSIS — Z3202 Encounter for pregnancy test, result negative: Secondary | ICD-10-CM

## 2022-05-11 LAB — POCT URINE PREGNANCY: Preg Test, Ur: NEGATIVE

## 2022-05-11 NOTE — Patient Instructions (Signed)
Nexplanon Instructions After Insertion  Keep bandage clean and dry for 24 hours  May use ice/Tylenol/Ibuprofen for soreness or pain  If you develop fever, drainage or increased warmth from incision site-contact office immediately   

## 2022-05-11 NOTE — Progress Notes (Signed)
Colleen Peterson is a 31 y.o. year old African American female here for Nexplanon insertion.  Patient's last menstrual period was 04/30/2022 (exact date)., and her pregnancy test today was negative.    Risks/benefits/side effects of Nexplanon have been discussed and her questions have been answered.  Specifically, a failure rate of 09/998 has been reported, with an increased failure rate if pt takes St. John's Wort and/or antiseizure medicaitons.  [name] is aware of the common side effect of irregular bleeding, which the incidence of decreases over time.  BP 102/62   Pulse 90   Ht 5\' 4"  (1.626 m)   Wt 169 lb (76.7 kg)   LMP 04/30/2022 (Exact Date)   Breastfeeding No   BMI 29.01 kg/m   No results found for this or any previous visit (from the past 24 hour(s)).   She is right-handed, so her left arm, approximately 4 inches proximal from the elbow, was cleansed with alcohol and anesthetized with 2cc of 2% Lidocaine.  The area was cleansed again with betadine and the Nexplanon was inserted per manufacturer's recommendations without difficulty.  A steri-strip and pressure bandage were applied.  Pt was instructed to keep the area clean and dry, remove pressure bandage in 24 hours, and keep insertion site covered with the steri-strip for 3-5 days.  Back up contraception was recommended for 2 weeks.  She was given a card indicating date Nexplanon was inserted and date it needs to be removed. Follow-up PRN problems.  05/02/2022 Naftoli Penny,CNM

## 2022-05-14 ENCOUNTER — Encounter: Payer: Self-pay | Admitting: Certified Nurse Midwife

## 2022-07-16 ENCOUNTER — Other Ambulatory Visit (HOSPITAL_COMMUNITY)
Admission: RE | Admit: 2022-07-16 | Discharge: 2022-07-16 | Disposition: A | Discharge status: Home / Self Care | Payer: Medicaid Other | Source: Ambulatory Visit | Attending: Certified Nurse Midwife | Admitting: Certified Nurse Midwife

## 2022-07-16 ENCOUNTER — Ambulatory Visit (INDEPENDENT_AMBULATORY_CARE_PROVIDER_SITE_OTHER): Payer: Medicaid Other | Admitting: Certified Nurse Midwife

## 2022-07-16 ENCOUNTER — Encounter: Payer: Self-pay | Admitting: Certified Nurse Midwife

## 2022-07-16 VITALS — BP 111/74 | HR 85 | Resp 16 | Ht 64.0 in | Wt 178.1 lb

## 2022-07-16 DIAGNOSIS — Z113 Encounter for screening for infections with a predominantly sexual mode of transmission: Secondary | ICD-10-CM | POA: Insufficient documentation

## 2022-07-16 DIAGNOSIS — Z01419 Encounter for gynecological examination (general) (routine) without abnormal findings: Secondary | ICD-10-CM | POA: Diagnosis present

## 2022-07-16 NOTE — Progress Notes (Signed)
GYNECOLOGY ANNUAL PREVENTATIVE CARE ENCOUNTER NOTE  History:     ARLYNE BRANDES is a 31 y.o. G2P1021 female here for a routine annual gynecologic exam.  Current complaints: whit discharge with odor.   Denies abnormal vaginal bleeding, discharge, pelvic pain, problems with intercourse or other gynecologic concerns.     Social Relationship:Single Living: with daughter Work: make up Tree surgeon  Exercise: active ( work part time [employer])  Smoke/Alcohol/drug EZM:OQHUTML on weekends  Gynecologic History No LMP recorded (within months). Contraception: Nexplanon Last Pap: 07/12/2021. Results were: normal with negative HPV Last mammogram: n/a.    Upstream - 07/16/22 1347       Pregnancy Intention Screening   Does the patient want to become pregnant in the next year? No    Does the patient's partner want to become pregnant in the next year? No    Would the patient like to discuss contraceptive options today? No      Contraception Wrap Up   Current Method Hormonal Implant    End Method Hormonal Implant    Contraception Counseling Provided No            The pregnancy intention screening data noted above was reviewed. Potential methods of contraception were discussed. The patient elected to proceed with Neplanon.   Obstetric History OB History  Gravida Para Term Preterm AB Living  3 1 1   2 1   SAB IAB Ectopic Multiple Live Births  1     0 1    # Outcome Date GA Lbr Len/2nd Weight Sex Delivery Anes PTL Lv  3 Term 02/13/18 [redacted]w[redacted]d  7 lb 2.6 oz (3.25 kg) F CS-LTranv EPI  LIV  2 AB 09/2016          1 SAB 2011            Past Medical History:  Diagnosis Date   BV (bacterial vaginosis)    Trichomoniasis 05/2017    Past Surgical History:  Procedure Laterality Date   CESAREAN SECTION N/A 02/13/2018   Procedure: CESAREAN SECTION;  Surgeon: 02/15/2018, MD;  Location: [facility] ORS;  Service: Obstetrics;  Laterality: N/A;    Current Outpatient Medications on File  Prior to Visit  Medication Sig Dispense Refill   etonogestrel (NEXPLANON) 68 MG IMPL implant 1 each by Subdermal route once.     No current facility-administered medications on file prior to visit.    No Known Allergies  Social History:  reports that she has never smoked. She has never used smokeless tobacco. She reports current alcohol use. She reports that she does not use drugs.  Family History  Problem Relation Age of Onset   Breast cancer Maternal Grandmother     The following portions of the patient's history were reviewed and updated as appropriate: allergies, current medications, past family history, past medical history, past social history, past surgical history and problem list.  Review of Systems Pertinent items noted in HPI and remainder of comprehensive ROS otherwise negative.  Physical Exam:  BP 111/74   Pulse 85   Resp 16   Ht 5\' 4"  (1.626 m)   Wt 178 lb 1.6 oz (80.8 kg)   LMP  (Within Months) Comment: approx 2 months ago  SpO2 99%   BMI 30.57 kg/m  CONSTITUTIONAL: Well-developed, well-nourished female in no acute distress.  HENT:  Normocephalic, atraumatic, External right and left ear normal. Oropharynx is clear and moist EYES: Conjunctivae and EOM are normal. Pupils are  equal, round, and reactive to light. No scleral icterus.  NECK: Normal range of motion, supple, no masses.  Normal thyroid.  SKIN: Skin is warm and dry. No rash noted. Not diaphoretic. No erythema. No pallor. MUSCULOSKELETAL: Normal range of motion. No tenderness.  No cyanosis, clubbing, or edema.  2+ distal pulses. NEUROLOGIC: Alert and oriented to person, place, and time. Normal reflexes, muscle tone coordination.  PSYCHIATRIC: Normal mood and affect. Normal behavior. Normal judgment and thought content. CARDIOVASCULAR: Normal heart rate noted, regular rhythm RESPIRATORY: Clear to auscultation bilaterally. Effort and breath sounds normal, no problems with respiration noted. BREASTS:  Symmetric in size. No masses, tenderness, skin changes, nipple drainage, or lymphadenopathy bilaterally.  ABDOMEN: Soft, no distention noted.  No tenderness, rebound or guarding.  PELVIC: Normal appearing external genitalia and urethral meatus; normal appearing vaginal mucosa and cervix.  White discharge noted.  Pap smear not due.  Normal uterine size, no other palpable masses, no uterine or adnexal tenderness.  .   Assessment and Plan:    1. Women's annual routine gynecological examination  Pap: not due until 2027 Mammogram : n/a  Labs: vaginal swab  Refills: none Referral: none Routine preventative health maintenance measures emphasized. Please refer to After Visit Summary for other counseling recommendations.      Doreene Burke, CNM [facility]  [facility],  [facility]

## 2022-07-18 ENCOUNTER — Encounter: Payer: Self-pay | Admitting: Certified Nurse Midwife

## 2022-07-18 ENCOUNTER — Other Ambulatory Visit: Payer: Self-pay | Admitting: Certified Nurse Midwife

## 2022-07-18 LAB — CERVICOVAGINAL ANCILLARY ONLY
Bacterial Vaginitis (gardnerella): POSITIVE — AB
Candida Glabrata: NEGATIVE
Candida Vaginitis: NEGATIVE
Chlamydia: NEGATIVE
Comment: NEGATIVE
Comment: NEGATIVE
Comment: NEGATIVE
Comment: NEGATIVE
Comment: NEGATIVE
Comment: NORMAL
Neisseria Gonorrhea: NEGATIVE
Trichomonas: POSITIVE — AB

## 2022-07-18 MED ORDER — METRONIDAZOLE 500 MG PO TABS: 500.0000 mg | ORAL_TABLET | Freq: Two times a day (BID) | ORAL | 0 refills | Status: AC

## 2022-08-02 ENCOUNTER — Ambulatory Visit (INDEPENDENT_AMBULATORY_CARE_PROVIDER_SITE_OTHER): Payer: Medicaid Other | Admitting: Dermatology

## 2022-08-02 DIAGNOSIS — Z79899 Other long term (current) drug therapy: Secondary | ICD-10-CM

## 2022-08-02 DIAGNOSIS — L700 Acne vulgaris: Secondary | ICD-10-CM

## 2022-08-02 NOTE — Progress Notes (Signed)
   New Patient Visit  Subjective  Colleen Peterson is a 31 y.o. female who presents for the following: Acne (New patient - Treated with Accuatane in 2016 and she was clear. Acne came back during Covid while she was masking. She took her birth control implant out to see if that was the cause but it did not change.).  The following portions of the chart were reviewed this encounter and updated as appropriate:   Tobacco  Allergies  Meds  Problems  Med Hx  Surg Hx  Fam Hx     Review of Systems:  No other skin or systemic complaints except as noted in HPI or Assessment and Plan.  Objective  Well appearing patient in no apparent distress; mood and affect are within normal limits.  A focused examination was performed including face. Relevant physical exam findings are noted in the Assessment and Plan.  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  Women on isotretinoin must be celibate (not having sex) or required to use at least 2 birth control methods to prevent pregnancy (unless patient is a female of non-child bearing potential).  Females of child-bearing potential must have monthly pregnancy tests while on isotretinoin and report through [employer]-P[employer] (FDA monitoring program). Reviewed reports of suicidal ideation in those with a history of depression while taking isotretinoin and reports of diagnosis of inflammatory bowl disease (IBD) while taking isotretinoin as well as the lack of evidence for a causal relationship between isotretinoin, depression and IBD. Patient advised to reach out with any  questions or concerns. Patient advised not to share pills or donate blood while on treatment or for one month after completing treatment. All patient's considering Isotretinoin must read and understand and sign Isotretinoin Consent Form and be registered with I-P[employer].   Urine pregnancy test performed in office today and was negative.  Patient demonstrates comprehension and confirms she will not get pregnant. Lot [id number] Exp 10/24/2023  Consent forms signed today    Return in about 1 month (around 09/01/2022).  I, Joanie Coddington, CMA, am acting as scribe for Armida Sans, MD . Documentation: I have reviewed the above documentation for accuracy and completeness, and I agree with the above.  Armida Sans, MD

## 2022-08-02 NOTE — Patient Instructions (Addendum)
Isotretinoin Counseling; Review and Contraception Counseling: Reviewed potential side effects of isotretinoin including xerosis, cheilitis, hepatitis, hyperlipidemia, and severe birth defects if taken by a pregnant woman.  Women on isotretinoin must be celibate (not having sex) or required to use at least 2 birth control methods to prevent pregnancy (unless patient is a female of non-child bearing potential).  Females of child-bearing potential must have monthly pregnancy tests while on isotretinoin and report through I-Pledge (FDA monitoring program). Reviewed reports of suicidal ideation in those with a history of depression while taking isotretinoin and reports of diagnosis of inflammatory bowl disease (IBD) while taking isotretinoin as well as the lack of evidence for a causal relationship between isotretinoin, depression and IBD. Patient advised to reach out with any questions or concerns. Patient advised not to share pills or donate blood while on treatment or for one month after completing treatment. All patient's considering Isotretinoin must read and understand and sign Isotretinoin Consent Form and be registered with I-Pledge.   Due to recent changes in healthcare laws, you may see results of your pathology and/or laboratory studies on MyChart before the doctors have had a chance to review them. We understand that in some cases there may be results that are confusing or concerning to you. Please understand that not all results are received at the same time and often the doctors may need to interpret multiple results in order to provide you with the best plan of care or course of treatment. Therefore, we ask that you please give us 2 business days to thoroughly review all your results before contacting the office for clarification. Should we see a critical lab result, you will be contacted sooner.   If You Need Anything After Your Visit  If you have any questions or concerns for your doctor,  please call our main line at [phone] and press option 4 to reach your doctor's medical assistant. If no one answers, please leave a voicemail as directed and we will return your call as soon as possible. Messages left after 4 pm will be answered the following business day.   You may also send us a message via MyChart. We typically respond to MyChart messages within 1-2 business days.  For prescription refills, please ask your pharmacy to contact our office. Our fax number is [phone].  If you have an urgent issue when the clinic is closed that cannot wait until the next business day, you can page your doctor at the number below.    Please note that while we do our best to be available for urgent issues outside of office hours, we are not available 24/7.   If you have an urgent issue and are unable to reach us, you may choose to seek medical care at your doctor's office, retail clinic, urgent care center, or emergency room.  If you have a medical emergency, please immediately call 911 or go to the emergency department.  Pager Numbers  - Dr. Kowalski: [phone]  - Dr. Moye: [phone]  - Dr. Stewart: [phone]  In the event of inclement weather, please call our main line at [phone] for an update on the status of any delays or closures.  Dermatology Medication Tips: Please keep the boxes that topical medications come in in order to help keep track of the instructions about where and how to use these. Pharmacies typically print the medication instructions only on the boxes and not directly on the medication tubes.   If your medication is too expensive, please   contact our office at [phone] option 4 or send us a message through MyChart.   We are unable to tell what your co-pay for medications will be in advance as this is different depending on your insurance coverage. However, we may be able to find a substitute medication at lower cost or fill out paperwork to get  insurance to cover a needed medication.   If a prior authorization is required to get your medication covered by your insurance company, please allow us 1-2 business days to complete this process.  Drug prices often vary depending on where the prescription is filled and some pharmacies may offer cheaper prices.  The website www.goodrx.com contains coupons for medications through different pharmacies. The prices here do not account for what the cost may be with help from insurance (it may be cheaper with your insurance), but the website can give you the price if you did not use any insurance.  - You can print the associated coupon and take it with your prescription to the pharmacy.  - You may also stop by our office during regular business hours and pick up a GoodRx coupon card.  - If you need your prescription sent electronically to a different pharmacy, notify our office through [facility] MyChart or by phone at [phone] option 4.     Si Usted Necesita Algo Despus de Su Visita  Tambin puede enviarnos un mensaje a travs de MyChart. Por lo general respondemos a los mensajes de MyChart en el transcurso de 1 a 2 das hbiles.  Para renovar recetas, por favor pida a su farmacia que se ponga en contacto con nuestra oficina. Nuestro nmero de fax es el [phone].  Si tiene un asunto urgente cuando la clnica est cerrada y que no puede esperar hasta el siguiente da hbil, puede llamar/localizar a su doctor(a) al nmero que aparece a continuacin.   Por favor, tenga en cuenta que aunque hacemos todo lo posible para estar disponibles para asuntos urgentes fuera del horario de oficina, no estamos disponibles las 24 horas del da, los 7 das de la semana.   Si tiene un problema urgente y no puede comunicarse con nosotros, puede optar por buscar atencin mdica  en el consultorio de su doctor(a), en una clnica privada, en un centro de atencin urgente o en una sala de emergencias.  Si  tiene una emergencia mdica, por favor llame inmediatamente al 911 o vaya a la sala de emergencias.  Nmeros de bper  - Dr. Kowalski: [phone]  - Dra. Moye: [phone]  - Dra. Stewart: [phone]  En caso de inclemencias del tiempo, por favor llame a nuestra lnea principal al [phone] para una actualizacin sobre el estado de cualquier retraso o cierre.  Consejos para la medicacin en dermatologa: Por favor, guarde las cajas en las que vienen los medicamentos de uso tpico para ayudarle a seguir las instrucciones sobre dnde y cmo usarlos. Las farmacias generalmente imprimen las instrucciones del medicamento slo en las cajas y no directamente en los tubos del medicamento.   Si su medicamento es muy caro, por favor, pngase en contacto con nuestra oficina llamando al [phone] y presione la opcin 4 o envenos un mensaje a travs de MyChart.   No podemos decirle cul ser su copago por los medicamentos por adelantado ya que esto es diferente dependiendo de la cobertura de su seguro. Sin embargo, es posible que podamos encontrar un medicamento sustituto a menor costo o llenar un formulario para que el seguro cubra el   medicamento que se considera necesario.   Si se requiere una autorizacin previa para que su compaa de seguros cubra su medicamento, por favor permtanos de 1 a 2 das hbiles para completar este proceso.  Los precios de los medicamentos varan con frecuencia dependiendo del lugar de dnde se surte la receta y alguna farmacias pueden ofrecer precios ms baratos.  El sitio web www.goodrx.com tiene cupones para medicamentos de diferentes farmacias. Los precios aqu no tienen en cuenta lo que podra costar con la ayuda del seguro (puede ser ms barato con su seguro), pero el sitio web puede darle el precio si no utiliz ningn seguro.  - Puede imprimir el cupn correspondiente y llevarlo con su receta a la farmacia.  - Tambin puede pasar por nuestra oficina  durante el horario de atencin regular y recoger una tarjeta de cupones de GoodRx.  - Si necesita que su receta se enve electrnicamente a una farmacia diferente, informe a nuestra oficina a travs de MyChart de [place] o por telfono llamando al [phone] y presione la opcin 4.  

## 2022-08-13 ENCOUNTER — Encounter: Payer: Self-pay | Admitting: Dermatology

## 2022-09-06 ENCOUNTER — Ambulatory Visit (INDEPENDENT_AMBULATORY_CARE_PROVIDER_SITE_OTHER): Payer: Medicaid Other | Admitting: Dermatology

## 2022-09-06 DIAGNOSIS — Z79899 Other long term (current) drug therapy: Secondary | ICD-10-CM

## 2022-09-06 DIAGNOSIS — L700 Acne vulgaris: Secondary | ICD-10-CM | POA: Diagnosis not present

## 2022-09-06 NOTE — Patient Instructions (Signed)
Due to recent changes in healthcare laws, you may see results of your pathology and/or laboratory studies on MyChart before the doctors have had a chance to review them. We understand that in some cases there may be results that are confusing or concerning to you. Please understand that not all results are received at the same time and often the doctors may need to interpret multiple results in order to provide you with the best plan of care or course of treatment. Therefore, we ask that you please give us 2 business days to thoroughly review all your results before contacting the office for clarification. Should we see a critical lab result, you will be contacted sooner.   If You Need Anything After Your Visit  If you have any questions or concerns for your doctor, please call our main line at [phone] and press option 4 to reach your doctor's medical assistant. If no one answers, please leave a voicemail as directed and we will return your call as soon as possible. Messages left after 4 pm will be answered the following business day.   You may also send us a message via MyChart. We typically respond to MyChart messages within 1-2 business days.  For prescription refills, please ask your pharmacy to contact our office. Our fax number is [phone].  If you have an urgent issue when the clinic is closed that cannot wait until the next business day, you can page your doctor at the number below.    Please note that while we do our best to be available for urgent issues outside of office hours, we are not available 24/7.   If you have an urgent issue and are unable to reach us, you may choose to seek medical care at your doctor's office, retail clinic, urgent care center, or emergency room.  If you have a medical emergency, please immediately call 911 or go to the emergency department.  Pager Numbers  - Dr. Kowalski: [phone]  - Dr. Moye: [phone]  - Dr. Stewart:  [phone]  In the event of inclement weather, please call our main line at [phone] for an update on the status of any delays or closures.  Dermatology Medication Tips: Please keep the boxes that topical medications come in in order to help keep track of the instructions about where and how to use these. Pharmacies typically print the medication instructions only on the boxes and not directly on the medication tubes.   If your medication is too expensive, please contact our office at [phone] option 4 or send us a message through MyChart.   We are unable to tell what your co-pay for medications will be in advance as this is different depending on your insurance coverage. However, we may be able to find a substitute medication at lower cost or fill out paperwork to get insurance to cover a needed medication.   If a prior authorization is required to get your medication covered by your insurance company, please allow us 1-2 business days to complete this process.  Drug prices often vary depending on where the prescription is filled and some pharmacies may offer cheaper prices.  The website www.goodrx.com contains coupons for medications through different pharmacies. The prices here do not account for what the cost may be with help from insurance (it may be cheaper with your insurance), but the website can give you the price if you did not use any insurance.  - You can print the associated coupon and take it with   your prescription to the pharmacy.  - You may also stop by our office during regular business hours and pick up a GoodRx coupon card.  - If you need your prescription sent electronically to a different pharmacy, notify our office through [facility] MyChart or by phone at [phone] option 4.     Si Usted Necesita Algo Despus de Su Visita  Tambin puede enviarnos un mensaje a travs de MyChart. Por lo general respondemos a los mensajes de MyChart en el transcurso de 1 a 2  das hbiles.  Para renovar recetas, por favor pida a su farmacia que se ponga en contacto con nuestra oficina. Nuestro nmero de fax es el [phone].  Si tiene un asunto urgente cuando la clnica est cerrada y que no puede esperar hasta el siguiente da hbil, puede llamar/localizar a su doctor(a) al nmero que aparece a continuacin.   Por favor, tenga en cuenta que aunque hacemos todo lo posible para estar disponibles para asuntos urgentes fuera del horario de oficina, no estamos disponibles las 24 horas del da, los 7 das de la semana.   Si tiene un problema urgente y no puede comunicarse con nosotros, puede optar por buscar atencin mdica  en el consultorio de su doctor(a), en una clnica privada, en un centro de atencin urgente o en una sala de emergencias.  Si tiene una emergencia mdica, por favor llame inmediatamente al 911 o vaya a la sala de emergencias.  Nmeros de bper  - Dr. Kowalski: [phone]  - Dra. Moye: [phone]  - Dra. Stewart: [phone]  En caso de inclemencias del tiempo, por favor llame a nuestra lnea principal al [phone] para una actualizacin sobre el estado de cualquier retraso o cierre.  Consejos para la medicacin en dermatologa: Por favor, guarde las cajas en las que vienen los medicamentos de uso tpico para ayudarle a seguir las instrucciones sobre dnde y cmo usarlos. Las farmacias generalmente imprimen las instrucciones del medicamento slo en las cajas y no directamente en los tubos del medicamento.   Si su medicamento es muy caro, por favor, pngase en contacto con nuestra oficina llamando al [phone] y presione la opcin 4 o envenos un mensaje a travs de MyChart.   No podemos decirle cul ser su copago por los medicamentos por adelantado ya que esto es diferente dependiendo de la cobertura de su seguro. Sin embargo, es posible que podamos encontrar un medicamento sustituto a menor costo o llenar un formulario para que el  seguro cubra el medicamento que se considera necesario.   Si se requiere una autorizacin previa para que su compaa de seguros cubra su medicamento, por favor permtanos de 1 a 2 das hbiles para completar este proceso.  Los precios de los medicamentos varan con frecuencia dependiendo del lugar de dnde se surte la receta y alguna farmacias pueden ofrecer precios ms baratos.  El sitio web www.goodrx.com tiene cupones para medicamentos de diferentes farmacias. Los precios aqu no tienen en cuenta lo que podra costar con la ayuda del seguro (puede ser ms barato con su seguro), pero el sitio web puede darle el precio si no utiliz ningn seguro.  - Puede imprimir el cupn correspondiente y llevarlo con su receta a la farmacia.  - Tambin puede pasar por nuestra oficina durante el horario de atencin regular y recoger una tarjeta de cupones de GoodRx.  - Si necesita que su receta se enve electrnicamente a una farmacia diferente, informe a nuestra oficina a travs de MyChart de [facility]   o por telfono llamando al [phone] y presione la opcin 4.  

## 2022-09-06 NOTE — Progress Notes (Signed)
   Follow-Up Visit   Subjective  Colleen Peterson is a 32 y.o. female who presents for the following: Acne (Patient here after 30 day wait to start Isotretinoin. She was on Isotretinoin in 2016. ).  The following portions of the chart were reviewed this encounter and updated as appropriate:   Tobacco  Allergies  Meds  Problems  Med Hx  Surg Hx  Fam Hx     Review of Systems:  No other skin or systemic complaints except as noted in HPI or Assessment and Plan.  Objective  Well appearing patient in no apparent distress; mood and affect are within normal limits.  A focused examination was performed including the face. Relevant physical exam findings are noted in the Assessment and Plan.  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 Condition is symptomatic / bothersome to patient. Not to goal.  Acne is Severe; chronic and persistent; not at goal. Patient is on Isotretinoin -  requiring FDA mandated monthly evaluations and laboratory monitoring.  While taking Isotretinoin and for 30 days after you finish the medication, do not get pregnant, do not share pills, do not donate blood.  Generic isotretinoin is best absorbed when taken with a fatty meal. Isotretinoin can make you sensitive to the sun. Daily careful sun protection including sunscreen SPF 30+ when outdoors is recommended.  Isotretinoin Counseling; Review and Contraception Counseling: Reviewed potential side effects of isotretinoin including xerosis, cheilitis, hepatitis, hyperlipidemia, and severe birth defects if taken by a pregnant woman.  Women on isotretinoin must be celibate (not having sex) or required to use at least 2 birth control methods to prevent pregnancy (unless patient is a female of non-child bearing potential).  Females of child-bearing potential must have monthly pregnancy tests while on isotretinoin and report  through I-Pledge (FDA monitoring program). Reviewed reports of suicidal ideation in those with a history of depression while taking isotretinoin and reports of diagnosis of inflammatory bowl disease (IBD) while taking isotretinoin as well as the lack of evidence for a causal relationship between isotretinoin, depression and IBD. Patient advised to reach out with any questions or concerns. Patient advised not to share pills or donate blood while on treatment or for one month after completing treatment. All patient's considering Isotretinoin must read and understand and sign Isotretinoin Consent Form and be registered with I-Pledge. While taking Isotretinoin and for 30 days after you finish the medication, do not get pregnant, do not share pills, do not donate blood.  Generic isotretinoin is best absorbed when taken with a fatty meal. Isotretinoin can make you sensitive to the sun. Daily careful sun protection including sunscreen SPF 30+ when outdoors is recommended.  Pending labs start Isotretinoin 20 mg po QD. Discontinue all other acne medications.  Nexplanon implant, female latex condoms [facility]  Ipledge [phone]  Related Procedures Comprehensive metabolic panel Lipid panel hCG, serum, qualitative  Return in about 1 month (around 10/07/2022) for Isotretinoin follow up .  Luther Redo, CMA, am acting as scribe for Sarina Ser, MD . Documentation: I have reviewed the above documentation for accuracy and completeness, and I agree with the above.  Sarina Ser, MD

## 2022-09-07 ENCOUNTER — Encounter: Payer: Self-pay | Admitting: Dermatology

## 2022-10-08 ENCOUNTER — Ambulatory Visit: Payer: Medicaid Other | Admitting: Dermatology

## 2022-11-08 ENCOUNTER — Ambulatory Visit: Payer: Managed Care, Other (non HMO) | Admitting: Dermatology

## 2022-12-13 ENCOUNTER — Ambulatory Visit: Payer: Managed Care, Other (non HMO) | Admitting: Dermatology

## 2022-12-18 ENCOUNTER — Ambulatory Visit: Payer: Managed Care, Other (non HMO) | Admitting: Dermatology

## 2023-01-17 ENCOUNTER — Ambulatory Visit: Payer: Managed Care, Other (non HMO) | Admitting: Dermatology

## 2023-08-12 NOTE — Progress Notes (Unsigned)
GYNECOLOGY ANNUAL PREVENTATIVE CARE ENCOUNTER NOTE  History:     Colleen Peterson is a 32 y.o. G42P1021 female here for a routine annual gynecologic exam.  Current complaints: patient states that a week ago she began experiencing breast tenderness and swelling of breast, patient states this has resolved now.   Denies abnormal vaginal bleeding, discharge, pelvic pain, problems with intercourse or other gynecologic concerns.     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   Gynecologic History No LMP recorded. Patient has had an implant. Contraception: Nexplanon 2026 Last Pap: 07/12/2021. Results were: normal with negative HPV   Obstetric History OB History  Gravida Para Term Preterm AB Living  3 1 1   2 1   SAB IAB Ectopic Multiple Live Births  1     0 1    # Outcome Date GA Lbr Len/2nd Weight Sex Type Anes PTL Lv  3 Term 02/13/18 [redacted]w[redacted]d  7 lb 2.6 oz (3.25 kg) F CS-LTranv EPI  LIV  2 AB 09/2016          1 SAB 2011            Past Medical History:  Diagnosis Date   BV (bacterial vaginosis)    Trichomoniasis 05/2017    Past Surgical History:  Procedure Laterality Date   CESAREAN SECTION N/A 02/13/2018   Procedure: CESAREAN SECTION;  Surgeon: Vena Austria, MD;  Location: [facility] ORS;  Service: Obstetrics;  Laterality: N/A;    Current Outpatient Medications on File Prior to Visit  Medication Sig Dispense Refill   etonogestrel (NEXPLANON) 68 MG IMPL implant 1 each by Subdermal route once.     No current facility-administered medications on file prior to visit.    No Known Allergies  Social History:  reports that she has never smoked. She has never used smokeless tobacco. She reports current alcohol use. She reports that she does not use drugs.  Family History   Problem Relation Age of Onset   Breast cancer Maternal Grandmother     The following portions of the patient's history were reviewed and updated as appropriate: allergies, current medications, past family history, past medical history, past social history, past surgical history and problem list.  Review of Systems Pertinent items noted in HPI and remainder of comprehensive ROS otherwise negative.  Physical Exam:  BP 98/66   Pulse 74   Ht 5\' 4"  (1.626 m)   Wt 186 lb (84.4 kg)   BMI 31.93 kg/m  CONSTITUTIONAL: Well-developed, well-nourished female in no acute distress.  HENT:  Normocephalic, atraumatic, External right and left ear normal. Oropharynx is clear and moist EYES: Conjunctivae and EOM are normal. Pupils are equal, round, and reactive to light. No scleral icterus.  NECK: Normal range of motion, supple, no masses.  Normal thyroid.  SKIN: Skin is warm and dry. No rash noted. Not diaphoretic. No erythema. No pallor. MUSCULOSKELETAL: Normal range of motion. No tenderness.  No cyanosis, clubbing, or edema.  2+ distal pulses. NEUROLOGIC: Alert and oriented to person, place, and time. Normal reflexes, muscle tone coordination.  PSYCHIATRIC: Normal mood and affect. Normal behavior. Normal judgment and thought content. CARDIOVASCULAR: Normal heart rate noted, regular rhythm RESPIRATORY: Clear to auscultation bilaterally. Effort and breath sounds normal, no problems with respiration noted. BREASTS: Symmetric in size. No masses, tenderness, skin changes, nipple  drainage, or lymphadenopathy bilaterally.  ABDOMEN: Soft, no distention noted.  No tenderness, rebound or guarding.  PELVIC: Normal appearing external genitalia and urethral meatus; normal appearing vaginal mucosa and cervix.  No abnormal discharge noted.  Pap smear not due.  Normal uterine size, no other palpable masses, no uterine or adnexal tenderness.  .   Assessment and Plan:    Annual Well Women Gyn Exam   Pap: not due   Mammogram : n/a  Labs: pt request swab for STD/infection screen  Refills: none Declines Flu Referral: none  Routine preventative health maintenance measures emphasized. Please refer to After Visit Summary for other counseling recommendations.      Doreene Burke, CNM [facility]  [facility],  [facility]

## 2023-08-13 ENCOUNTER — Ambulatory Visit (INDEPENDENT_AMBULATORY_CARE_PROVIDER_SITE_OTHER): Payer: Managed Care, Other (non HMO) | Admitting: Certified Nurse Midwife

## 2023-08-13 ENCOUNTER — Other Ambulatory Visit (HOSPITAL_COMMUNITY)
Admission: RE | Admit: 2023-08-13 | Discharge: 2023-08-13 | Disposition: A | Discharge status: Home / Self Care | Payer: Managed Care, Other (non HMO) | Source: Ambulatory Visit | Attending: Certified Nurse Midwife | Admitting: Certified Nurse Midwife

## 2023-08-13 ENCOUNTER — Encounter: Payer: Self-pay | Admitting: Certified Nurse Midwife

## 2023-08-13 VITALS — BP 98/66 | HR 74 | Ht 64.0 in | Wt 186.0 lb

## 2023-08-13 DIAGNOSIS — Z01419 Encounter for gynecological examination (general) (routine) without abnormal findings: Secondary | ICD-10-CM | POA: Diagnosis present

## 2023-08-13 DIAGNOSIS — Z113 Encounter for screening for infections with a predominantly sexual mode of transmission: Secondary | ICD-10-CM | POA: Diagnosis present

## 2023-08-13 NOTE — Patient Instructions (Signed)

## 2023-08-15 LAB — CERVICOVAGINAL ANCILLARY ONLY
Bacterial Vaginitis (gardnerella): POSITIVE — AB
Candida Glabrata: NEGATIVE
Candida Vaginitis: NEGATIVE
Chlamydia: NEGATIVE
Comment: NEGATIVE
Comment: NEGATIVE
Comment: NEGATIVE
Comment: NEGATIVE
Comment: NEGATIVE
Comment: NORMAL
Neisseria Gonorrhea: NEGATIVE
Trichomonas: NEGATIVE

## 2023-08-19 ENCOUNTER — Other Ambulatory Visit: Payer: Self-pay | Admitting: Certified Nurse Midwife

## 2023-08-19 ENCOUNTER — Encounter: Payer: Self-pay | Admitting: Certified Nurse Midwife

## 2023-08-19 MED ORDER — METRONIDAZOLE 500 MG PO TABS: 500.0000 mg | ORAL_TABLET | Freq: Two times a day (BID) | ORAL | 0 refills | Status: AC

## 2024-04-08 ENCOUNTER — Ambulatory Visit: Admitting: Dermatology

## 2024-04-08 ENCOUNTER — Encounter: Payer: Self-pay | Admitting: Dermatology

## 2024-04-08 DIAGNOSIS — L700 Acne vulgaris: Secondary | ICD-10-CM | POA: Diagnosis not present

## 2024-04-08 DIAGNOSIS — L905 Scar conditions and fibrosis of skin: Secondary | ICD-10-CM | POA: Diagnosis not present

## 2024-04-08 DIAGNOSIS — Z7189 Other specified counseling: Secondary | ICD-10-CM

## 2024-04-08 DIAGNOSIS — L819 Disorder of pigmentation, unspecified: Secondary | ICD-10-CM

## 2024-04-08 DIAGNOSIS — Z79899 Other long term (current) drug therapy: Secondary | ICD-10-CM

## 2024-04-08 NOTE — Patient Instructions (Signed)

## 2024-04-08 NOTE — Progress Notes (Signed)
   Follow-Up Visit   Subjective  Colleen Peterson is a 33 y.o. female who presents for the following: Acne Vulgaris Previously had been on accutane in 2016, would like to restart treatment.  She returned last year to re-start, but failed follow up - today she says it was because she was catching up on some bills before she re-started Isotretinoin  The following portions of the chart were reviewed this encounter and updated as appropriate: medications, allergies, medical history  Review of Systems:  No other skin or systemic complaints except as noted in HPI or Assessment and Plan.  Objective  Well appearing patient in no apparent distress; mood and affect are within normal limits.  Areas Examined: Face, chest and back  Relevant exam findings are noted in the Assessment and Plan.   Assessment & Plan   ACNE VULGARIS   LONG-TERM USE OF HIGH-RISK MEDICATION   ACNE VULGARIS With active papules and spotty dyschromia and mild scarring S/P previous course of Isotretinoin in 2016 Exam: Papules on lower face and mandible  Dyschromia with spotty hypopigmentation and minimal scarring at face Chronic and persistent condition with duration or expected duration over one year. Condition is symptomatic / bothersome to patient. Not to goal. Treatment Plan: Patient registered in [place] #[id number]  Urine pregnancy test performed in office today and was negative.  Patient demonstrates comprehension and confirms she will not get pregnant.   2 forms of BC are Nexplanon  implant, female latex condoms  [facility]   Patient would like to start at high dose. Will check labs at next visit and pending results will plan to start isotretinoin 30 mg po qd with food  Acne is Severe; chronic and persistent; not at goal. Patient is on Isotretinoin -  requiring FDA mandated monthly evaluations and laboratory monitoring.   While taking Isotretinoin and for 30 days after you finish the medication,  do not get pregnant, do not share pills, do not donate blood.  Generic isotretinoin is best absorbed when taken with a fatty meal. Isotretinoin can make you sensitive to the sun. Daily careful sun protection including sunscreen SPF 30+ when outdoors is recommended.  Isotretinoin Counseling; Review and Contraception Counseling: Reviewed potential side effects of isotretinoin including xerosis, cheilitis, hepatitis, hyperlipidemia, and severe birth defects if taken by a pregnant woman.  Women on isotretinoin must be celibate (not having sex) or required to use at least 2 birth control methods to prevent pregnancy (unless patient is a female of non-child bearing potential).  Females of child-bearing potential must have monthly pregnancy tests while on isotretinoin and report through I-Pledge (FDA monitoring program). Reviewed reports of suicidal ideation in those with a history of depression while taking isotretinoin and reports of diagnosis of inflammatory bowl disease (IBD) while taking isotretinoin as well as the lack of evidence for a causal relationship between isotretinoin, depression and IBD. Patient advised to reach out with any questions or concerns. Patient advised not to share pills or donate blood while on treatment or for one month after completing treatment. All patient's considering Isotretinoin must read and understand and sign Isotretinoin Consent Form and be registered with I-Pledge.  Return in about 30 days (around 05/08/2024) for isotretinoin.  IEleanor Blush, CMA, am acting as scribe for Alm Rhyme, MD.   Documentation: I have reviewed the above documentation for accuracy and completeness, and I agree with the above.  Alm Rhyme, MD

## 2024-05-13 ENCOUNTER — Ambulatory Visit (INDEPENDENT_AMBULATORY_CARE_PROVIDER_SITE_OTHER): Admitting: Dermatology

## 2024-05-13 ENCOUNTER — Encounter: Payer: Self-pay | Admitting: Dermatology

## 2024-05-13 DIAGNOSIS — L700 Acne vulgaris: Secondary | ICD-10-CM

## 2024-05-13 DIAGNOSIS — L905 Scar conditions and fibrosis of skin: Secondary | ICD-10-CM | POA: Diagnosis not present

## 2024-05-13 DIAGNOSIS — Z7189 Other specified counseling: Secondary | ICD-10-CM | POA: Diagnosis not present

## 2024-05-13 DIAGNOSIS — Z79899 Other long term (current) drug therapy: Secondary | ICD-10-CM

## 2024-05-13 NOTE — Progress Notes (Signed)
   Follow-Up Visit   Subjective  Colleen Peterson is a 33 y.o. female who presents for the following: Acne Vulgaris - pt here after 30 day wait to restart Isotretinoin , pt completed course in 2016.   The following portions of the chart were reviewed this encounter and updated as appropriate: medications, allergies, medical history  Review of Systems:  No other skin or systemic complaints except as noted in HPI or Assessment and Plan.  Objective  Well appearing patient in no apparent distress; mood and affect are within normal limits.  Areas Examined: Face, chest and back  Relevant exam findings are noted in the Assessment and Plan.   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 Condition is symptomatic / bothersome to patient. Not to goal.  Treatment Plan: Patient registered in [place] #[id number]  2 forms of BC are Nexplanon  implant, female latex condoms [facility]    Patient would like to start at high dose. Pending labs start Isotretinoin  30 mg po QD.   Acne is Severe; chronic and persistent; not at goal. Patient is on Isotretinoin  -  requiring FDA mandated monthly evaluations and laboratory monitoring.   While taking Isotretinoin  and for 30 days after you finish the medication, do not get pregnant, do not share pills, do not donate blood.  Generic isotretinoin  is best absorbed when taken with a fatty meal. Isotretinoin  can make you sensitive to the sun. Daily careful sun protection including sunscreen SPF 30+ when outdoors is recommended.   Isotretinoin  Counseling;  Review and Contraception Counseling: Reviewed potential side effects of isotretinoin  including xerosis, cheilitis, hepatitis, hyperlipidemia, and severe birth defects if taken by a pregnant woman.  Women on isotretinoin  must be celibate (not having sex) or required to use at least 2 birth control methods to prevent pregnancy (unless patient is a female of non-child bearing potential).  Females of child-bearing potential must have monthly pregnancy tests while on isotretinoin  and report through I-Pledge (FDA monitoring program). Reviewed reports of suicidal ideation in those with a history of depression while taking isotretinoin  and reports of diagnosis of inflammatory bowl disease (IBD) while taking isotretinoin  as well as the lack of evidence for a causal relationship between isotretinoin , depression and IBD. Patient advised to reach out with any questions or concerns. Patient advised not to share pills or donate blood while on treatment or for one month after completing treatment. All patient's considering Isotretinoin  must read and understand and sign Isotretinoin  Consent Form and be registered with I-Pledge.  Return in about 1 month (around 06/12/2024) for Isotretinoin  follow up.  LILLETTE Rosina Mayans, CMA, am acting as scribe for Alm Rhyme, MD .   Documentation: I have reviewed the above documentation for accuracy and completeness, and I agree with the above.  Alm Rhyme, MD

## 2024-05-13 NOTE — Patient Instructions (Signed)

## 2024-05-14 ENCOUNTER — Ambulatory Visit: Payer: Self-pay | Admitting: Dermatology

## 2024-05-14 LAB — COMPREHENSIVE METABOLIC PANEL WITH GFR
ALT: 19 IU/L (ref 0–32)
AST: 15 IU/L (ref 0–40)
Albumin: 4.7 g/dL (ref 3.9–4.9)
Alkaline Phosphatase: 70 IU/L (ref 44–121)
BUN/Creatinine Ratio: 11 (ref 9–23)
BUN: 7 mg/dL (ref 6–20)
Bilirubin Total: 0.4 mg/dL (ref 0.0–1.2)
CO2: 18 mmol/L — ABNORMAL LOW (ref 20–29)
Calcium: 9.2 mg/dL (ref 8.7–10.2)
Chloride: 104 mmol/L (ref 96–106)
Creatinine, Ser: 0.66 mg/dL (ref 0.57–1.00)
Globulin, Total: 2.9 g/dL (ref 1.5–4.5)
Glucose: 87 mg/dL (ref 70–99)
Potassium: 3.8 mmol/L (ref 3.5–5.2)
Sodium: 139 mmol/L (ref 134–144)
Total Protein: 7.6 g/dL (ref 6.0–8.5)
eGFR: 119 mL/min/1.73

## 2024-05-14 LAB — LIPID PANEL
Chol/HDL Ratio: 5.8 ratio — ABNORMAL HIGH (ref 0.0–4.4)
Cholesterol, Total: 220 mg/dL — ABNORMAL HIGH (ref 100–199)
HDL: 38 mg/dL — ABNORMAL LOW
LDL Chol Calc (NIH): 164 mg/dL — ABNORMAL HIGH (ref 0–99)
Triglycerides: 98 mg/dL (ref 0–149)
VLDL Cholesterol Cal: 18 mg/dL (ref 5–40)

## 2024-05-14 LAB — HCG, SERUM, QUALITATIVE: hCG,Beta Subunit,Qual,Serum: NEGATIVE m[IU]/mL

## 2024-05-14 MED ORDER — ISOTRETINOIN 30 MG PO CAPS: 30.0000 mg | ORAL_CAPSULE | Freq: Every day | ORAL | 0 refills | Status: DC

## 2024-06-18 ENCOUNTER — Other Ambulatory Visit: Payer: Self-pay

## 2024-06-18 ENCOUNTER — Encounter: Payer: Self-pay | Admitting: Dermatology

## 2024-06-18 ENCOUNTER — Ambulatory Visit: Admitting: Dermatology

## 2024-06-18 VITALS — Wt 186.0 lb

## 2024-06-18 DIAGNOSIS — L700 Acne vulgaris: Secondary | ICD-10-CM

## 2024-06-18 DIAGNOSIS — Z79899 Other long term (current) drug therapy: Secondary | ICD-10-CM | POA: Diagnosis not present

## 2024-06-18 DIAGNOSIS — Z7189 Other specified counseling: Secondary | ICD-10-CM | POA: Diagnosis not present

## 2024-06-18 MED ORDER — ISOTRETINOIN 40 MG PO CAPS: 40.0000 mg | ORAL_CAPSULE | Freq: Every day | ORAL | 0 refills | Status: AC

## 2024-06-18 NOTE — Patient Instructions (Addendum)
 Use a mild face wash     While taking Isotretinoin  and for 30 days after you finish the medication, do not get pregnant, do not share pills, do not donate blood.  Generic isotretinoin  is best absorbed when taken with a fatty meal. Isotretinoin  can make you sensitive to the sun. Daily careful sun protection including sunscreen SPF 30+ when outdoors is recommended.    Due to recent changes in healthcare laws, you may see results of your pathology and/or laboratory studies on MyChart before the doctors have had a chance to review them. We understand that in some cases there may be results that are confusing or concerning to you. Please understand that not all results are received at the same time and often the doctors may need to interpret multiple results in order to provide you with the best plan of care or course of treatment. Therefore, we ask that you please give us  2 business days to thoroughly review all your results before contacting the office for clarification. Should we see a critical lab result, you will be contacted sooner.   If You Need Anything After Your Visit  If you have any questions or concerns for your doctor, please call our main line at [phone] and press option 4 to reach your doctor's medical assistant. If no one answers, please leave a voicemail as directed and we will return your call as soon as possible. Messages left after 4 pm will be answered the following business day.   You may also send us  a message via MyChart. We typically respond to MyChart messages within 1-2 business days.  For prescription refills, please ask your pharmacy to contact our office. Our fax number is [phone].  If you have an urgent issue when the clinic is closed that cannot wait until the next business day, you can page your doctor at the number below.    Please note that while we do our best to be available for urgent issues outside of office hours, we are not available 24/7.   If  you have an urgent issue and are unable to reach us , you may choose to seek medical care at your doctor's office, retail clinic, urgent care center, or emergency room.  If you have a medical emergency, please immediately call 911 or go to the emergency department.  Pager Numbers  - Dr. Hester: [phone]  - Dr. Jackquline: [phone]  - Dr. Claudene: [phone]   - Dr. Raymund: [phone]  In the event of inclement weather, please call our main line at [phone] for an update on the status of any delays or closures.  Dermatology Medication Tips: Please keep the boxes that topical medications come in in order to help keep track of the instructions about where and how to use these. Pharmacies typically print the medication instructions only on the boxes and not directly on the medication tubes.   If your medication is too expensive, please contact our office at [phone] option 4 or send us  a message through MyChart.   We are unable to tell what your co-pay for medications will be in advance as this is different depending on your insurance coverage. However, we may be able to find a substitute medication at lower cost or fill out paperwork to get insurance to cover a needed medication.   If a prior authorization is required to get your medication covered by your insurance company, please allow us  1-2 business days to complete this process.  Drug prices often vary depending  on where the prescription is filled and some pharmacies may offer cheaper prices.  The website www.goodrx.com contains coupons for medications through different pharmacies. The prices here do not account for what the cost may be with help from insurance (it may be cheaper with your insurance), but the website can give you the price if you did not use any insurance.  - You can print the associated coupon and take it with your prescription to the pharmacy.  - You may also stop by our office during regular business  hours and pick up a GoodRx coupon card.  - If you need your prescription sent electronically to a different pharmacy, notify our office through [facility] or by phone at [phone] option 4.     Si Usted Necesita Algo Despus de Su Visita  Tambin puede enviarnos un mensaje a travs de Clinical cytogeneticist. Por lo general respondemos a los mensajes de MyChart en el transcurso de 1 a 2 das hbiles.  Para renovar recetas, por favor pida a su farmacia que se ponga en contacto con nuestra oficina. Randi lakes de fax es [place] [phone].  Si tiene un asunto urgente cuando la clnica est cerrada y que no puede esperar hasta el siguiente da hbil, puede llamar/localizar a su doctor(a) al nmero que aparece a continuacin.   Por favor, tenga en cuenta que aunque hacemos todo lo posible para estar disponibles para asuntos urgentes fuera del horario de [place], no estamos disponibles las 24 horas del da, los 7 [address]  [address] de la [place].   Si tiene un problema urgente y no puede comunicarse con nosotros, puede optar por buscar atencin mdica  en el consultorio de su doctor(a), en una clnica privada, en un centro de atencin urgente o en una sala de emergencias.  Si tiene Engineer, drilling, por favor llame inmediatamente al 911 o vaya a la sala de emergencias.  Nmeros de bper  - Dr. Hester: [phone]  - Dra. Jackquline: [phone]  - Dr. Claudene: [phone]  - Dra. Kitts: [phone]  En caso de inclemencias del [place], por favor llame a nuestra lnea principal al [phone] para una actualizacin sobre el estado de cualquier retraso o cierre.  Consejos para la medicacin en dermatologa: Por favor, guarde las cajas en las que vienen los medicamentos de uso tpico para ayudarle a seguir las instrucciones sobre dnde y cmo usarlos. Las farmacias generalmente imprimen las instrucciones del medicamento slo en las cajas y no directamente en los tubos del [place].   Si su  medicamento es muy caro, por favor, pngase en contacto con landry rieger llamando al [phone] y presione la opcin 4 o envenos un mensaje a travs de Clinical cytogeneticist.   No podemos decirle cul ser su copago por los medicamentos por adelantado ya que esto es diferente dependiendo de la cobertura de su seguro. Sin embargo, es posible que podamos encontrar un medicamento sustituto a Audiological scientist un formulario para que el seguro cubra el medicamento que se considera necesario.   Si se requiere una autorizacin previa para que su compaa de seguros malta su medicamento, por favor permtanos de 1 a 2 das hbiles para completar este proceso.  Los precios de los medicamentos varan con frecuencia dependiendo del Environmental consultant de dnde se surte la receta y alguna farmacias pueden ofrecer precios ms baratos.  El sitio web www.goodrx.com tiene cupones para medicamentos de Health and safety inspector. Los precios aqu no tienen en cuenta lo que podra costar con la ayuda del seguro (puede ser ms  barato con su seguro), pero el sitio web puede darle el precio si no Visual merchandiser.  - Puede imprimir el cupn correspondiente y llevarlo con su receta a la farmacia.  - Tambin puede pasar por nuestra oficina durante el horario de atencin regular y Education officer, museum una tarjeta de cupones de GoodRx.  - Si necesita que su receta se enve electrnicamente a una farmacia diferente, informe a nuestra oficina a travs de MyChart de [place] o por telfono llamando al [phone] y presione la opcin 4.

## 2024-06-18 NOTE — Progress Notes (Signed)
 Isotretinoin  Follow-Up Visit   Subjective  Colleen Peterson is a 33 y.o. female who presents for the following: Isotretinoin  follow-up Still having some breakouts at chin area  Reports some dry lips, dry skin and dry eyes   Week # 4  IPledge #[id number]    Isotretinoin  F/U - 06/18/24 1000       Isotretinoin  Follow Up   iPledge # [id number]    Date 06/18/24    Weight 186 lb (84.4 kg)    Two Forms of Birth Control Implant;Female Condom    Acne breakouts since last visit? Yes      Dosage   Target Dosage (mg) 12660    Current (To Date) Dosage (mg) 900    To Go Dosage (mg) 11760      Skin Side Effects   Dry Lips Yes    Nose bleeds No    Dry eyes Yes    Dry Skin Yes    Sunburn No      Gastrointestinal Side Effects   Nausea No    Diarrhea No    Blood in stool No      Neurological Side Effects   Blurred vision No    Depression No    Headache No    Homicidal thoughts No    Mood Changes No    Suicidal thoughts No      Constitutional Side Effects   Fatigue No      Musculoskeletal Side Effects   Muscle aches No      Labs Notes   Last labs done 05/13/24         Side effects: Dry skin, dry lips  Patient is not pregnant, not seeking pregnancy, and not breastfeeding.   The following portions of the chart were reviewed this encounter and updated as appropriate: medications, allergies, medical history  Review of Systems:  No other skin or systemic complaints except as noted in HPI or Assessment and Plan.  Objective  Well appearing patient in no apparent distress; mood and affect are within normal limits.  An examination of the face, neck, chest, and back was performed and relevant findings are noted below.     Assessment & Plan   ACNE VULGARIS   Related Medications ISOtretinoin  (ABSORICA ) 40 MG capsule Take 1 capsule (40 mg total) by mouth daily.  ACNE VULGARIS Patient is currently on Isotretinoin  requiring FDA mandated monthly evaluations and  laboratory monitoring. Condition is currently not to goal (must reach target dose based on weight and also have clear skin for 2 months prior to discontinuation in order to help prevent relapse)  Exam findings: Some spotty hyperpigmentation at face  Week # 4 Pharmacy [facility]  iPLEDGE # [id number] Total mg -  900 Total mg/kg - 10.66 mg/kg Birth Control- 2 forms of BC are Nexplanon  implant, female latex condoms   Increase isotretinoin  30 to 40 mg po qd. Will send in Absorica  40 mg to [facility] if covered   Urine pregnancy test performed in office today and was negative.  Patient demonstrates comprehension and confirms she will not get pregnant.    Patient confirmed in iPledge and isotretinoin  sent to pharmacy.    Isotretinoin  Counseling; Review and Contraception Counseling: Reviewed potential side effects of isotretinoin  including xerosis, cheilitis, hepatitis, hyperlipidemia, and severe birth defects if taken by a pregnant woman.  Women on isotretinoin  must be celibate (not having sex) or required to use at least 2 birth control methods to prevent pregnancy (unless patient is a female  of non-child bearing potential).  Females of child-bearing potential must have monthly pregnancy tests while on isotretinoin  and report through I-Pledge (FDA monitoring program). Reviewed reports of suicidal ideation in those with a history of depression while taking isotretinoin  and reports of diagnosis of inflammatory bowl disease (IBD) while taking isotretinoin  as well as the lack of evidence for a causal relationship between isotretinoin , depression and IBD. Patient advised to reach out with any questions or concerns. Patient advised not to share pills or donate blood while on treatment or for one month after completing treatment. All patient's considering Isotretinoin  must read and understand and sign Isotretinoin  Consent Form and be registered with I-Pledge.  Xerosis secondary to isotretinoin   therapy - Continue emollients as directed - Xyzal (levocetirizine) once a day and fish oil 1 gram daily may also help with dryness   Cheilitis secondary to isotretinoin  therapy - Continue lip balm as directed, Dr. Horald Cortibalm recommended   Long term medication management (isotretinoin )  Patient is using long term (months to years) prescription medication  to control their dermatologic condition.  These medications require periodic monitoring to evaluate for efficacy and side effects and may require periodic laboratory monitoring.  - While taking Isotretinoin  and for 30 days after you finish the medication, do not get pregnant, do not share pills, do not donate blood. Isotretinoin  is best absorbed when taken with a fatty meal. Isotretinoin  can make you sensitive to the sun. Daily careful sun protection including sunscreen SPF 30+ when outdoors is recommended.  Follow-up in 30 days.  IEleanor Blush, CMA, am acting as scribe for Alm Rhyme, MD.   Documentation: I have reviewed the above documentation for accuracy and completeness, and I agree with the above.  Alm Rhyme, MD

## 2024-07-23 ENCOUNTER — Ambulatory Visit (INDEPENDENT_AMBULATORY_CARE_PROVIDER_SITE_OTHER): Admitting: Dermatology

## 2024-07-23 VITALS — Wt 186.0 lb

## 2024-07-23 DIAGNOSIS — Z79899 Other long term (current) drug therapy: Secondary | ICD-10-CM | POA: Diagnosis not present

## 2024-07-23 DIAGNOSIS — Z7189 Other specified counseling: Secondary | ICD-10-CM

## 2024-07-23 DIAGNOSIS — L853 Xerosis cutis: Secondary | ICD-10-CM

## 2024-07-23 DIAGNOSIS — K130 Diseases of lips: Secondary | ICD-10-CM

## 2024-07-23 DIAGNOSIS — L700 Acne vulgaris: Secondary | ICD-10-CM

## 2024-07-23 MED ORDER — ISOTRETINOIN 30 MG PO CAPS: 30.0000 mg | ORAL_CAPSULE | Freq: Two times a day (BID) | ORAL | 0 refills | Status: AC

## 2024-07-23 NOTE — Progress Notes (Signed)
 Isotretinoin  Follow-Up Visit   Subjective  Colleen Peterson is a 33 y.o. female who presents for the following: Isotretinoin  follow-up. Patient currently taking 40 mg with no side effects. Patient wanting to increase dose.   Week # 8   Isotretinoin  F/U - 07/23/24 1100       Isotretinoin  Follow Up   iPledge # [id number]    Date 07/23/24    Weight 186 lb (84.4 kg)    Two Forms of Birth Control Implant;Female Condom      Dosage   Target Dosage (mg) 12660    Current (To Date) Dosage (mg) 2100    To Go Dosage (mg) 10560      Skin Side Effects   Dry Lips Yes    Nose bleeds No    Dry eyes No    Dry Skin No    Sunburn No      Gastrointestinal Side Effects   Nausea No    Diarrhea No    Blood in stool No      Neurological Side Effects   Blurred vision No    Depression No    Headache No    Homicidal thoughts No    Mood Changes No    Suicidal thoughts No      Constitutional Side Effects   Fatigue Yes      Musculoskeletal Side Effects   Muscle aches Yes           Side effects: Dry skin, dry lips  Patient is not pregnant, not seeking pregnancy, and not breastfeeding.   The following portions of the chart were reviewed this encounter and updated as appropriate: medications, allergies, medical history  Review of Systems:  No other skin or systemic complaints except as noted in HPI or Assessment and Plan.  Objective  Well appearing patient in no apparent distress; mood and affect are within normal limits.  An examination of the face, neck, chest, and back was performed and relevant findings are noted below.     Assessment & Plan     ACNE VULGARIS Patient is currently on Isotretinoin  requiring FDA mandated monthly evaluations and laboratory monitoring. Condition is currently not to goal (must reach target dose based on weight and also have clear skin for 2 months prior to discontinuation in order to help prevent relapse)  Exam findings: One active papule  and some spotty hyperpigmentation at face.  Week # 8 Pharmacy  [facility]  iPLEDGE # [id number]  Total mg -  2100 mg Total mg/kg - 24.88 mg/kg Birth Control-  Nexplanon  implant, female latex condoms   Increase isotretinoin  60 mg take with food.  Urine pregnancy test performed in office today and was negative.  Patient demonstrates comprehension and confirms she will not get pregnant.   Patient confirmed in iPledge and isotretinoin  sent to pharmacy.   Isotretinoin  Counseling; Review and Contraception Counseling: Reviewed potential side effects of isotretinoin  including xerosis, cheilitis, hepatitis, hyperlipidemia, and severe birth defects if taken by a pregnant woman.  Women on isotretinoin  must be celibate (not having sex) or required to use at least 2 birth control methods to prevent pregnancy (unless patient is a female of non-child bearing potential).  Females of child-bearing potential must have monthly pregnancy tests while on isotretinoin  and report through I-Pledge (FDA monitoring program). Reviewed reports of suicidal ideation in those with a history of depression while taking isotretinoin  and reports of diagnosis of inflammatory bowl disease (IBD) while taking isotretinoin  as well as the lack of  evidence for a causal relationship between isotretinoin , depression and IBD. Patient advised to reach out with any questions or concerns. Patient advised not to share pills or donate blood while on treatment or for one month after completing treatment. All patient's considering Isotretinoin  must read and understand and sign Isotretinoin  Consent Form and be registered with I-Pledge.  Xerosis secondary to isotretinoin  therapy - Continue emollients as directed - Xyzal (levocetirizine) once a day and fish oil 1 gram daily may also help with dryness   Cheilitis secondary to isotretinoin  therapy - Continue lip balm as directed, Dr. Horald Cortibalm recommended  Long term medication  management (isotretinoin )  Patient is using long term (months to years) prescription medication  to control their dermatologic condition.  These medications require periodic monitoring to evaluate for efficacy and side effects and may require periodic laboratory monitoring.  - While taking Isotretinoin  and for 30 days after you finish the medication, do not get pregnant, do not share pills, do not donate blood. Isotretinoin  is best absorbed when taken with a fatty meal. Isotretinoin  can make you sensitive to the sun. Daily careful sun protection including sunscreen SPF 30+ when outdoors is recommended.  Follow-up in 30 days.  I, Lamira Borin V. Wilfred, CMA, am acting as scribe for Alm Rhyme, MD .   Documentation: I have reviewed the above documentation for accuracy and completeness, and I agree with the above.  Alm Rhyme, MD

## 2024-07-23 NOTE — Patient Instructions (Signed)

## 2024-07-27 ENCOUNTER — Encounter: Payer: Self-pay | Admitting: Dermatology

## 2024-08-20 ENCOUNTER — Ambulatory Visit: Admitting: Dermatology

## 2024-08-20 VITALS — Wt 186.0 lb

## 2024-08-20 DIAGNOSIS — L700 Acne vulgaris: Secondary | ICD-10-CM

## 2024-08-20 DIAGNOSIS — Z79899 Other long term (current) drug therapy: Secondary | ICD-10-CM

## 2024-08-20 DIAGNOSIS — L819 Disorder of pigmentation, unspecified: Secondary | ICD-10-CM

## 2024-08-20 DIAGNOSIS — Z7189 Other specified counseling: Secondary | ICD-10-CM | POA: Diagnosis not present

## 2024-08-20 NOTE — Patient Instructions (Signed)

## 2024-08-20 NOTE — Progress Notes (Signed)
 Isotretinoin  Follow-Up Visit   Subjective  Colleen Peterson is a 33 y.o. female who presents for the following: Isotretinoin  follow-up  Week # 12    Isotretinoin  F/U - 08/20/24 0800       Isotretinoin  Follow Up   iPledge # [id number]    Date 08/20/24    Weight 186 lb (84.4 kg)    Two Forms of Birth Control Implant;Female Condom      Dosage   Target Dosage (mg) 12660    Current (To Date) Dosage (mg) 3900    To Go Dosage (mg) 8760           Side effects: Dry skin, dry lips  Patient is not pregnant, not seeking pregnancy, and not breastfeeding.   The following portions of the chart were reviewed this encounter and updated as appropriate: medications, allergies, medical history  Review of Systems:  No other skin or systemic complaints except as noted in HPI or Assessment and Plan.  Objective  Well appearing patient in no apparent distress; mood and affect are within normal limits.  An examination of the face, neck, chest, and back was performed and relevant findings are noted below.     Assessment & Plan   ACNE VULGARIS   Existing Treatments - ISOtretinoin  (ABSORICA ) 40 MG capsule - Take 1 capsule (40 mg total) by mouth daily. DYSCHROMIA   COUNSELING AND COORDINATION OF CARE   MEDICATION MANAGEMENT   LONG-TERM USE OF HIGH-RISK MEDICATION    ACNE VULGARIS With Dyschromia Patient is currently on Isotretinoin  requiring FDA mandated monthly evaluations and laboratory monitoring. Condition is currently not to goal (must reach target dose based on weight and also have clear skin for 2 months prior to discontinuation in order to help prevent relapse)  Exam findings: Few active papules, spotty hyperpigmentation at face, resolving at back  Week # 12 Pharmacy  [facility]  iPLEDGE # [id number]  Total mg -  2100 mg Total mg/kg - 46.20 mg/kg Birth Control-  Nexplanon  implant, female latex condoms   Continue isotretinoin  60 mg every day   Urine  pregnancy test performed in office today and was negative.    Patient demonstrates comprehension and confirms she will not get pregnant.   Patient confirmed in iPledge and isotretinoin  sent to pharmacy.   Isotretinoin  Counseling; Review and Contraception Counseling: Reviewed potential side effects of isotretinoin  including xerosis, cheilitis, hepatitis, hyperlipidemia, and severe birth defects if taken by a pregnant woman.  Women on isotretinoin  must be celibate (not having sex) or required to use at least 2 birth control methods to prevent pregnancy (unless patient is a female of non-child bearing potential).  Females of child-bearing potential must have monthly pregnancy tests while on isotretinoin  and report through I-Pledge (FDA monitoring program). Reviewed reports of suicidal ideation in those with a history of depression while taking isotretinoin  and reports of diagnosis of inflammatory bowl disease (IBD) while taking isotretinoin  as well as the lack of evidence for a causal relationship between isotretinoin , depression and IBD. Patient advised to reach out with any questions or concerns. Patient advised not to share pills or donate blood while on treatment or for one month after completing treatment. All patient's considering Isotretinoin  must read and understand and sign Isotretinoin  Consent Form and be registered with I-Pledge.  Xerosis secondary to isotretinoin  therapy - Continue emollients as directed - Xyzal (levocetirizine) once a day and fish oil 1 gram daily may also help with dryness   Cheilitis secondary to isotretinoin  therapy -  Continue lip balm as directed, Dr. Horald Cortibalm recommended   Long term medication management (isotretinoin ).  Patient is using long term (months to years) prescription medication  to control their dermatologic condition.  These medications require periodic monitoring to evaluate for efficacy and side effects and may require periodic laboratory  monitoring.  - While taking Isotretinoin  and for 30 days after you finish the medication, do not get pregnant, do not share pills, do not donate blood. Isotretinoin  is best absorbed when taken with a fatty meal. Isotretinoin  can make you sensitive to the sun. Daily careful sun protection including sunscreen SPF 30+ when outdoors is recommended.  Follow-up in 30 days.  LILLETTE Lonell Drones, RMA, am acting as scribe for Alm Rhyme, MD .   Documentation: I have reviewed the above documentation for accuracy and completeness, and I agree with the above.  Alm Rhyme, MD

## 2024-09-24 ENCOUNTER — Ambulatory Visit: Admitting: Dermatology

## 2024-10-01 ENCOUNTER — Encounter: Payer: Self-pay | Admitting: Dermatology

## 2024-10-01 ENCOUNTER — Ambulatory Visit: Admitting: Dermatology

## 2024-10-01 VITALS — Wt 186.0 lb

## 2024-10-01 DIAGNOSIS — L700 Acne vulgaris: Secondary | ICD-10-CM

## 2024-10-01 DIAGNOSIS — Z79899 Other long term (current) drug therapy: Secondary | ICD-10-CM

## 2024-10-01 DIAGNOSIS — Z7189 Other specified counseling: Secondary | ICD-10-CM

## 2024-10-01 MED ORDER — ISOTRETINOIN 30 MG PO CAPS: 60.0000 mg | ORAL_CAPSULE | Freq: Every day | ORAL | 0 refills | Status: AC

## 2024-10-01 NOTE — Patient Instructions (Signed)

## 2024-10-01 NOTE — Progress Notes (Signed)
 "  Isotretinoin  Follow-Up Visit   Subjective  Colleen Peterson is a 34 y.o. female who presents for the following: Isotretinoin  follow-up  Week # 16   Isotretinoin  F/U - 10/01/24 0900       Isotretinoin  Follow Up   iPledge # [id number]    Date 10/01/24    Weight 186 lb (84.4 kg)    Two Forms of Birth Control Implant;Female Condom      Dosage   Target Dosage (mg) 12660    Current (To Date) Dosage (mg) 5700    To Go Dosage (mg) 6960      Skin Side Effects   Dry Lips Yes    Nose bleeds No    Dry eyes No    Dry Skin No    Sunburn No      Gastrointestinal Side Effects   Nausea No    Diarrhea No    Blood in stool No      Neurological Side Effects   Blurred vision No    Depression No    Headache No    Homicidal thoughts No    Mood Changes No    Suicidal thoughts No      Constitutional Side Effects   Fatigue No      Musculoskeletal Side Effects   Muscle aches No           Side effects: Dry skin, dry lips  Patient is not pregnant, not seeking pregnancy, and not breastfeeding.   The following portions of the chart were reviewed this encounter and updated as appropriate: medications, allergies, medical history  Review of Systems:  No other skin or systemic complaints except as noted in HPI or Assessment and Plan.  Objective  Well appearing patient in no apparent distress; mood and affect are within normal limits.  An examination of the face, neck, chest, and back was performed and relevant findings are noted below.     Assessment & Plan     ACNE VULGARIS Patient is currently on Isotretinoin  requiring FDA mandated monthly evaluations and laboratory monitoring. Condition is currently not to goal (must reach target dose based on weight and also have clear skin for 2 months prior to discontinuation in order to help prevent relapse)  Exam findings: Pink macules at face, active papule at chin  Week # 16 Pharmacy  [facility]  iPLEDGE # [id number]   Total mg -  5700 mg Total mg/kg - 67.53 mg/kg Birth Control-  Nexplanon  implant, female latex condoms  Continue isotretinoin  60 mg every day. Patient advised this is a good dose for her and that we do not want to increase side effects.  Urine pregnancy test performed in office today and was negative.  Patient demonstrates comprehension and confirms she will not get pregnant.   Patient confirmed in iPledge and isotretinoin  sent to pharmacy.   Isotretinoin  Counseling; Review and Contraception Counseling: Reviewed potential side effects of isotretinoin  including xerosis, cheilitis, hepatitis, hyperlipidemia, and severe birth defects if taken by a pregnant woman.  Women on isotretinoin  must be celibate (not having sex) or required to use at least 2 birth control methods to prevent pregnancy (unless patient is a female of non-child bearing potential).  Females of child-bearing potential must have monthly pregnancy tests while on isotretinoin  and report through I-Pledge (FDA monitoring program). Reviewed reports of suicidal ideation in those with a history of depression while taking isotretinoin  and reports of diagnosis of inflammatory bowl disease (IBD) while taking isotretinoin  as well as the  lack of evidence for a causal relationship between isotretinoin , depression and IBD. Patient advised to reach out with any questions or concerns. Patient advised not to share pills or donate blood while on treatment or for one month after completing treatment. All patient's considering Isotretinoin  must read and understand and sign Isotretinoin  Consent Form and be registered with I-Pledge.  Xerosis secondary to isotretinoin  therapy - Continue emollients as directed - Xyzal (levocetirizine) once a day and fish oil 1 gram daily may also help with dryness   Cheilitis secondary to isotretinoin  therapy - Continue lip balm as directed, Dr. Horald Cortibalm recommended   Long term medication management  (isotretinoin ).  Patient is using long term (months to years) prescription medication  to control their dermatologic condition.  These medications require periodic monitoring to evaluate for efficacy and side effects and may require periodic laboratory monitoring.  - While taking Isotretinoin  and for 30 days after you finish the medication, do not get pregnant, do not share pills, do not donate blood. Isotretinoin  is best absorbed when taken with a fatty meal. Isotretinoin  can make you sensitive to the sun. Daily careful sun protection including sunscreen SPF 30+ when outdoors is recommended.  Follow-up in 30 days.  LILLETTE Lonell Drones, RMA, am acting as scribe for Alm Rhyme, MD .   Documentation: I have reviewed the above documentation for accuracy and completeness, and I agree with the above.  Alm Rhyme, MD "

## 2024-11-05 ENCOUNTER — Ambulatory Visit
